# Patient Record
Sex: Male | Born: 1955 | Race: White | Hispanic: No | Smoking: Never smoker
Health system: Southern US, Community
[De-identification: ages and names within clinical notes are randomized; demographics above are authoritative.]

## PROBLEM LIST (undated history)

## (undated) DIAGNOSIS — E785 Hyperlipidemia, unspecified: Secondary | ICD-10-CM

## (undated) DIAGNOSIS — E119 Type 2 diabetes mellitus without complications: Secondary | ICD-10-CM

## (undated) DIAGNOSIS — I2510 Atherosclerotic heart disease of native coronary artery without angina pectoris: Secondary | ICD-10-CM

## (undated) DIAGNOSIS — B182 Chronic viral hepatitis C: Secondary | ICD-10-CM

## (undated) HISTORY — DX: Hyperlipidemia, unspecified: E78.5

## (undated) HISTORY — DX: Atherosclerotic heart disease of native coronary artery without angina pectoris: I25.10

## (undated) HISTORY — PX: CHOLECYSTECTOMY: SHX55

---

## 2004-09-28 ENCOUNTER — Emergency Department: Payer: Self-pay | Admitting: Emergency Medicine

## 2004-10-15 ENCOUNTER — Ambulatory Visit: Payer: Self-pay | Admitting: Family Medicine

## 2004-10-23 ENCOUNTER — Ambulatory Visit: Payer: Self-pay | Admitting: Family Medicine

## 2004-10-30 ENCOUNTER — Encounter (INDEPENDENT_AMBULATORY_CARE_PROVIDER_SITE_OTHER): Payer: Self-pay | Admitting: Internal Medicine

## 2004-10-30 ENCOUNTER — Ambulatory Visit: Payer: Self-pay | Admitting: Family Medicine

## 2004-11-01 ENCOUNTER — Encounter (INDEPENDENT_AMBULATORY_CARE_PROVIDER_SITE_OTHER): Payer: Self-pay | Admitting: Internal Medicine

## 2004-11-01 LAB — CONVERTED CEMR LAB: Hgb A1c MFr Bld: 10.6 %

## 2004-11-06 ENCOUNTER — Ambulatory Visit: Payer: Self-pay | Admitting: Family Medicine

## 2005-01-02 ENCOUNTER — Ambulatory Visit: Payer: Self-pay | Admitting: Family Medicine

## 2005-01-02 ENCOUNTER — Encounter (INDEPENDENT_AMBULATORY_CARE_PROVIDER_SITE_OTHER): Payer: Self-pay | Admitting: Internal Medicine

## 2005-01-02 LAB — CONVERTED CEMR LAB: Hgb A1c MFr Bld: 7 %

## 2005-02-04 ENCOUNTER — Ambulatory Visit: Payer: Self-pay | Admitting: Family Medicine

## 2005-04-07 ENCOUNTER — Encounter (INDEPENDENT_AMBULATORY_CARE_PROVIDER_SITE_OTHER): Payer: Self-pay | Admitting: Internal Medicine

## 2005-04-07 ENCOUNTER — Ambulatory Visit: Payer: Self-pay | Admitting: Family Medicine

## 2005-05-21 ENCOUNTER — Ambulatory Visit: Payer: Self-pay | Admitting: Family Medicine

## 2005-09-01 ENCOUNTER — Ambulatory Visit: Payer: Self-pay | Admitting: Family Medicine

## 2005-09-29 ENCOUNTER — Ambulatory Visit: Payer: Self-pay | Admitting: Family Medicine

## 2005-09-29 ENCOUNTER — Encounter (INDEPENDENT_AMBULATORY_CARE_PROVIDER_SITE_OTHER): Payer: Self-pay | Admitting: Internal Medicine

## 2005-09-29 LAB — CONVERTED CEMR LAB: Hgb A1c MFr Bld: 6.6 %

## 2006-10-06 ENCOUNTER — Emergency Department: Payer: Self-pay | Admitting: Unknown Physician Specialty

## 2006-10-06 ENCOUNTER — Other Ambulatory Visit: Payer: Self-pay

## 2006-10-06 ENCOUNTER — Encounter (INDEPENDENT_AMBULATORY_CARE_PROVIDER_SITE_OTHER): Payer: Self-pay | Admitting: Internal Medicine

## 2006-10-08 ENCOUNTER — Ambulatory Visit: Payer: Self-pay | Admitting: Unknown Physician Specialty

## 2007-02-18 ENCOUNTER — Ambulatory Visit: Payer: Self-pay | Admitting: Family Medicine

## 2007-02-18 DIAGNOSIS — R413 Other amnesia: Secondary | ICD-10-CM | POA: Insufficient documentation

## 2007-02-18 DIAGNOSIS — E7800 Pure hypercholesterolemia, unspecified: Secondary | ICD-10-CM | POA: Insufficient documentation

## 2007-02-18 DIAGNOSIS — E119 Type 2 diabetes mellitus without complications: Secondary | ICD-10-CM | POA: Insufficient documentation

## 2007-02-18 DIAGNOSIS — L299 Pruritus, unspecified: Secondary | ICD-10-CM | POA: Insufficient documentation

## 2007-02-23 LAB — CONVERTED CEMR LAB
ALT: 197 units/L — ABNORMAL HIGH (ref 0–53)
AST: 128 units/L — ABNORMAL HIGH (ref 0–37)
Albumin: 4.2 g/dL (ref 3.5–5.2)
Alkaline Phosphatase: 55 units/L (ref 39–117)
BUN: 16 mg/dL (ref 6–23)
Calcium: 9.7 mg/dL (ref 8.4–10.5)
Chloride: 104 meq/L (ref 96–112)
Creatinine,U: 157.5 mg/dL
GFR calc non Af Amer: 127 mL/min
Hgb A1c MFr Bld: 8.5 % — ABNORMAL HIGH (ref 4.6–6.0)
LDL Cholesterol: 106 mg/dL — ABNORMAL HIGH (ref 0–99)
PSA: 0.36 ng/mL (ref 0.10–4.00)
Sodium: 140 meq/L (ref 135–145)
VLDL: 17 mg/dL (ref 0–40)

## 2007-02-24 ENCOUNTER — Encounter (INDEPENDENT_AMBULATORY_CARE_PROVIDER_SITE_OTHER): Payer: Self-pay | Admitting: Internal Medicine

## 2007-02-25 ENCOUNTER — Ambulatory Visit: Payer: Self-pay | Admitting: Family Medicine

## 2007-02-25 DIAGNOSIS — R945 Abnormal results of liver function studies: Secondary | ICD-10-CM | POA: Insufficient documentation

## 2007-02-26 ENCOUNTER — Telehealth (INDEPENDENT_AMBULATORY_CARE_PROVIDER_SITE_OTHER): Payer: Self-pay | Admitting: *Deleted

## 2007-03-05 ENCOUNTER — Ambulatory Visit: Payer: Self-pay | Admitting: Internal Medicine

## 2007-03-08 ENCOUNTER — Encounter (INDEPENDENT_AMBULATORY_CARE_PROVIDER_SITE_OTHER): Payer: Self-pay | Admitting: Internal Medicine

## 2007-03-08 LAB — CONVERTED CEMR LAB
ALT: 182 units/L — ABNORMAL HIGH (ref 0–53)
Albumin: 4 g/dL (ref 3.5–5.2)
Bilirubin, Direct: 0.2 mg/dL (ref 0.0–0.3)
GGT: 88 units/L — ABNORMAL HIGH (ref 7–51)

## 2007-03-29 ENCOUNTER — Encounter: Payer: Self-pay | Admitting: Family Medicine

## 2007-03-29 ENCOUNTER — Encounter (INDEPENDENT_AMBULATORY_CARE_PROVIDER_SITE_OTHER): Payer: Self-pay | Admitting: Internal Medicine

## 2007-04-01 ENCOUNTER — Ambulatory Visit: Payer: Self-pay | Admitting: Gastroenterology

## 2007-04-01 ENCOUNTER — Encounter (INDEPENDENT_AMBULATORY_CARE_PROVIDER_SITE_OTHER): Payer: Self-pay | Admitting: Internal Medicine

## 2007-04-13 ENCOUNTER — Telehealth (INDEPENDENT_AMBULATORY_CARE_PROVIDER_SITE_OTHER): Payer: Self-pay | Admitting: Internal Medicine

## 2007-04-13 ENCOUNTER — Encounter (INDEPENDENT_AMBULATORY_CARE_PROVIDER_SITE_OTHER): Payer: Self-pay | Admitting: Internal Medicine

## 2007-04-14 ENCOUNTER — Encounter (INDEPENDENT_AMBULATORY_CARE_PROVIDER_SITE_OTHER): Payer: Self-pay | Admitting: Internal Medicine

## 2007-04-14 ENCOUNTER — Ambulatory Visit: Payer: Self-pay | Admitting: Family Medicine

## 2007-04-14 DIAGNOSIS — B1710 Acute hepatitis C without hepatic coma: Secondary | ICD-10-CM | POA: Insufficient documentation

## 2007-04-20 LAB — CONVERTED CEMR LAB: Hgb A1c MFr Bld: 8.4 % — ABNORMAL HIGH (ref 4.6–6.0)

## 2007-04-30 ENCOUNTER — Ambulatory Visit: Payer: Self-pay | Admitting: Family Medicine

## 2007-05-03 ENCOUNTER — Telehealth: Payer: Self-pay | Admitting: Family Medicine

## 2007-08-05 ENCOUNTER — Ambulatory Visit: Payer: Self-pay | Admitting: Gastroenterology

## 2007-08-10 ENCOUNTER — Ambulatory Visit: Payer: Self-pay | Admitting: Gastroenterology

## 2008-02-02 ENCOUNTER — Emergency Department: Payer: Self-pay | Admitting: Emergency Medicine

## 2008-05-28 ENCOUNTER — Emergency Department: Payer: Self-pay | Admitting: Emergency Medicine

## 2008-12-26 ENCOUNTER — Ambulatory Visit: Payer: Self-pay | Admitting: General Practice

## 2009-02-26 ENCOUNTER — Ambulatory Visit: Payer: Self-pay

## 2010-05-19 LAB — CONVERTED CEMR LAB
Blood Glucose, Fasting: 155 mg/dL
TSH: 1.46 microintl units/mL

## 2010-05-21 NOTE — Consult Note (Signed)
Summary: [facility]  [facility]   Imported By: Lanelle Bal 05/17/2009 12:27:22  _____________________________________________________________________  External Attachment:    Type:   Image     Comment:   External Document

## 2010-12-20 ENCOUNTER — Ambulatory Visit: Payer: Self-pay | Admitting: Gastroenterology

## 2011-01-17 ENCOUNTER — Ambulatory Visit: Payer: Self-pay | Admitting: General Surgery

## 2011-01-20 ENCOUNTER — Ambulatory Visit: Payer: Self-pay | Admitting: General Surgery

## 2011-01-20 LAB — PATHOLOGY REPORT

## 2011-01-22 LAB — PATHOLOGY REPORT

## 2011-04-30 ENCOUNTER — Emergency Department: Payer: Self-pay | Admitting: Internal Medicine

## 2012-06-18 ENCOUNTER — Ambulatory Visit: Payer: Self-pay | Admitting: Oncology

## 2012-06-18 LAB — CBC CANCER CENTER
Eosinophil %: 3.8 %
HGB: 15.4 g/dL (ref 13.0–18.0)
Lymphocyte %: 35.9 %
MCH: 33.1 pg (ref 26.0–34.0)
Monocyte %: 9.7 %
Neutrophil #: 2.3 x10 3/mm (ref 1.4–6.5)
Neutrophil %: 49.5 %
Platelet: 91 x10 3/mm — ABNORMAL LOW (ref 150–440)
RDW: 13.3 % (ref 11.5–14.5)

## 2012-06-18 LAB — FERRITIN: Ferritin (ARMC): 670 ng/mL — ABNORMAL HIGH (ref 8–388)

## 2012-06-18 LAB — IRON AND TIBC
Iron Bind.Cap.(Total): 371 ug/dL (ref 250–450)
Iron Saturation: 62 %

## 2012-06-19 ENCOUNTER — Ambulatory Visit: Payer: Self-pay | Admitting: Oncology

## 2012-07-20 ENCOUNTER — Ambulatory Visit: Payer: Self-pay | Admitting: Oncology

## 2012-07-27 ENCOUNTER — Emergency Department: Payer: Self-pay | Admitting: Emergency Medicine

## 2012-07-27 LAB — CBC
HCT: 40.9 % (ref 40.0–52.0)
MCH: 33.6 pg (ref 26.0–34.0)
MCV: 98 fL (ref 80–100)
RBC: 4.18 10*6/uL — ABNORMAL LOW (ref 4.40–5.90)

## 2012-07-27 LAB — COMPREHENSIVE METABOLIC PANEL
Albumin: 3.9 g/dL (ref 3.4–5.0)
Alkaline Phosphatase: 86 U/L (ref 50–136)
Anion Gap: 4 — ABNORMAL LOW (ref 7–16)
Bilirubin,Total: 0.6 mg/dL (ref 0.2–1.0)
Calcium, Total: 8.9 mg/dL (ref 8.5–10.1)
Chloride: 108 mmol/L — ABNORMAL HIGH (ref 98–107)
Co2: 29 mmol/L (ref 21–32)
Creatinine: 0.88 mg/dL (ref 0.60–1.30)
EGFR (African American): >60
EGFR (Non-African Amer.): >60
Osmolality: 280 (ref 275–301)
Potassium: 3.5 mmol/L (ref 3.5–5.1)
SGPT (ALT): 190 U/L — ABNORMAL HIGH (ref 12–78)
Total Protein: 8.3 g/dL — ABNORMAL HIGH (ref 6.4–8.2)

## 2012-07-27 LAB — PRO B NATRIURETIC PEPTIDE: B-Type Natriuretic Peptide: 33 pg/mL (ref 0–125)

## 2012-07-27 LAB — MAGNESIUM: Magnesium: 1.8 mg/dL

## 2012-07-27 LAB — TSH: Thyroid Stimulating Horm: 6.37 u[IU]/mL — ABNORMAL HIGH

## 2012-07-27 LAB — CK TOTAL AND CKMB (NOT AT ARMC): CK-MB: <0.5 ng/mL — ABNORMAL LOW (ref 0.5–3.6)

## 2012-08-19 ENCOUNTER — Ambulatory Visit: Payer: Self-pay | Admitting: Oncology

## 2012-09-03 LAB — CBC CANCER CENTER
Basophil #: 0 x10 3/mm (ref 0.0–0.1)
Basophil %: 0.3 %
Eosinophil %: 4.3 %
HGB: 14.1 g/dL (ref 13.0–18.0)
Lymphocyte #: 1.4 x10 3/mm (ref 1.0–3.6)
Lymphocyte %: 31.8 %
MCH: 33.5 pg (ref 26.0–34.0)
MCV: 97 fL (ref 80–100)
Monocyte #: 0.3 x10 3/mm (ref 0.2–1.0)
Monocyte %: 7.2 %
Neutrophil #: 2.5 x10 3/mm (ref 1.4–6.5)
Neutrophil %: 56.4 %
Platelet: 88 x10 3/mm — ABNORMAL LOW (ref 150–440)
RBC: 4.21 10*6/uL — ABNORMAL LOW (ref 4.40–5.90)
WBC: 4.5 x10 3/mm (ref 3.8–10.6)

## 2012-09-03 LAB — IRON AND TIBC
Iron Bind.Cap.(Total): 381 ug/dL (ref 250–450)
Iron: 150 ug/dL (ref 65–175)
Unbound Iron-Bind.Cap.: 231 ug/dL

## 2012-09-19 ENCOUNTER — Ambulatory Visit: Payer: Self-pay | Admitting: Oncology

## 2012-10-19 ENCOUNTER — Ambulatory Visit: Payer: Self-pay | Admitting: Oncology

## 2012-11-19 ENCOUNTER — Ambulatory Visit: Payer: Self-pay | Admitting: Oncology

## 2012-12-17 LAB — CBC CANCER CENTER
Basophil #: 0 x10 3/mm (ref 0.0–0.1)
Basophil %: 0.9 %
Eosinophil %: 4.6 %
HCT: 43.4 % (ref 40.0–52.0)
HGB: 14.9 g/dL (ref 13.0–18.0)
Lymphocyte #: 1.6 x10 3/mm (ref 1.0–3.6)
MCV: 94 fL (ref 80–100)
Monocyte #: 0.4 x10 3/mm (ref 0.2–1.0)
RBC: 4.61 10*6/uL (ref 4.40–5.90)
WBC: 3.9 x10 3/mm (ref 3.8–10.6)

## 2012-12-17 LAB — IRON AND TIBC: Iron Bind.Cap.(Total): 441 ug/dL (ref 250–450)

## 2012-12-17 LAB — FERRITIN: Ferritin (ARMC): 100 ng/mL (ref 8–388)

## 2012-12-20 ENCOUNTER — Ambulatory Visit: Payer: Self-pay | Admitting: Oncology

## 2012-12-20 ENCOUNTER — Ambulatory Visit: Payer: Self-pay | Admitting: Endocrinology

## 2013-03-25 ENCOUNTER — Ambulatory Visit: Payer: Self-pay | Admitting: Oncology

## 2015-02-24 IMAGING — CR DG CHEST 2V
1 series · 2 of 2 positions shown · non-contrast
Comparison: none

REASON FOR EXAM: Chest Pain
COMMENTS:

[Series 1: x chest ap · 0.14mm/px · 2 of 2 slices shown]
[im 1/2]
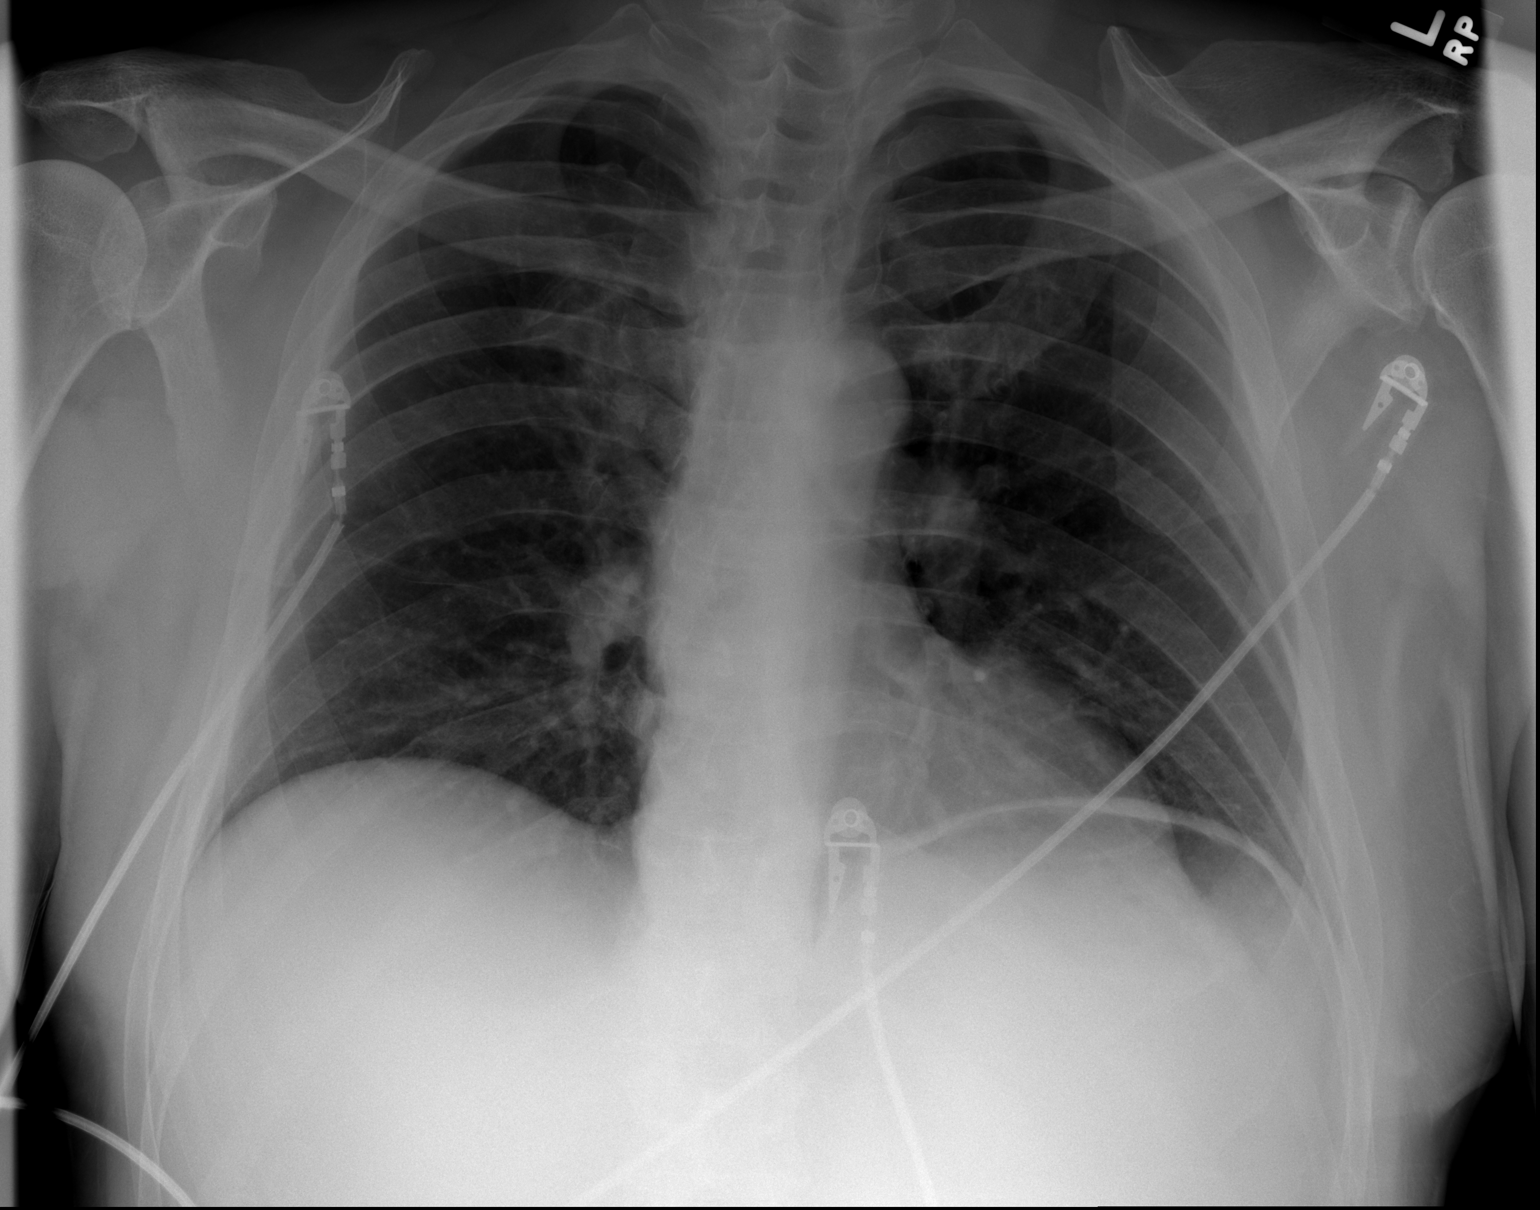
[im 2/2]
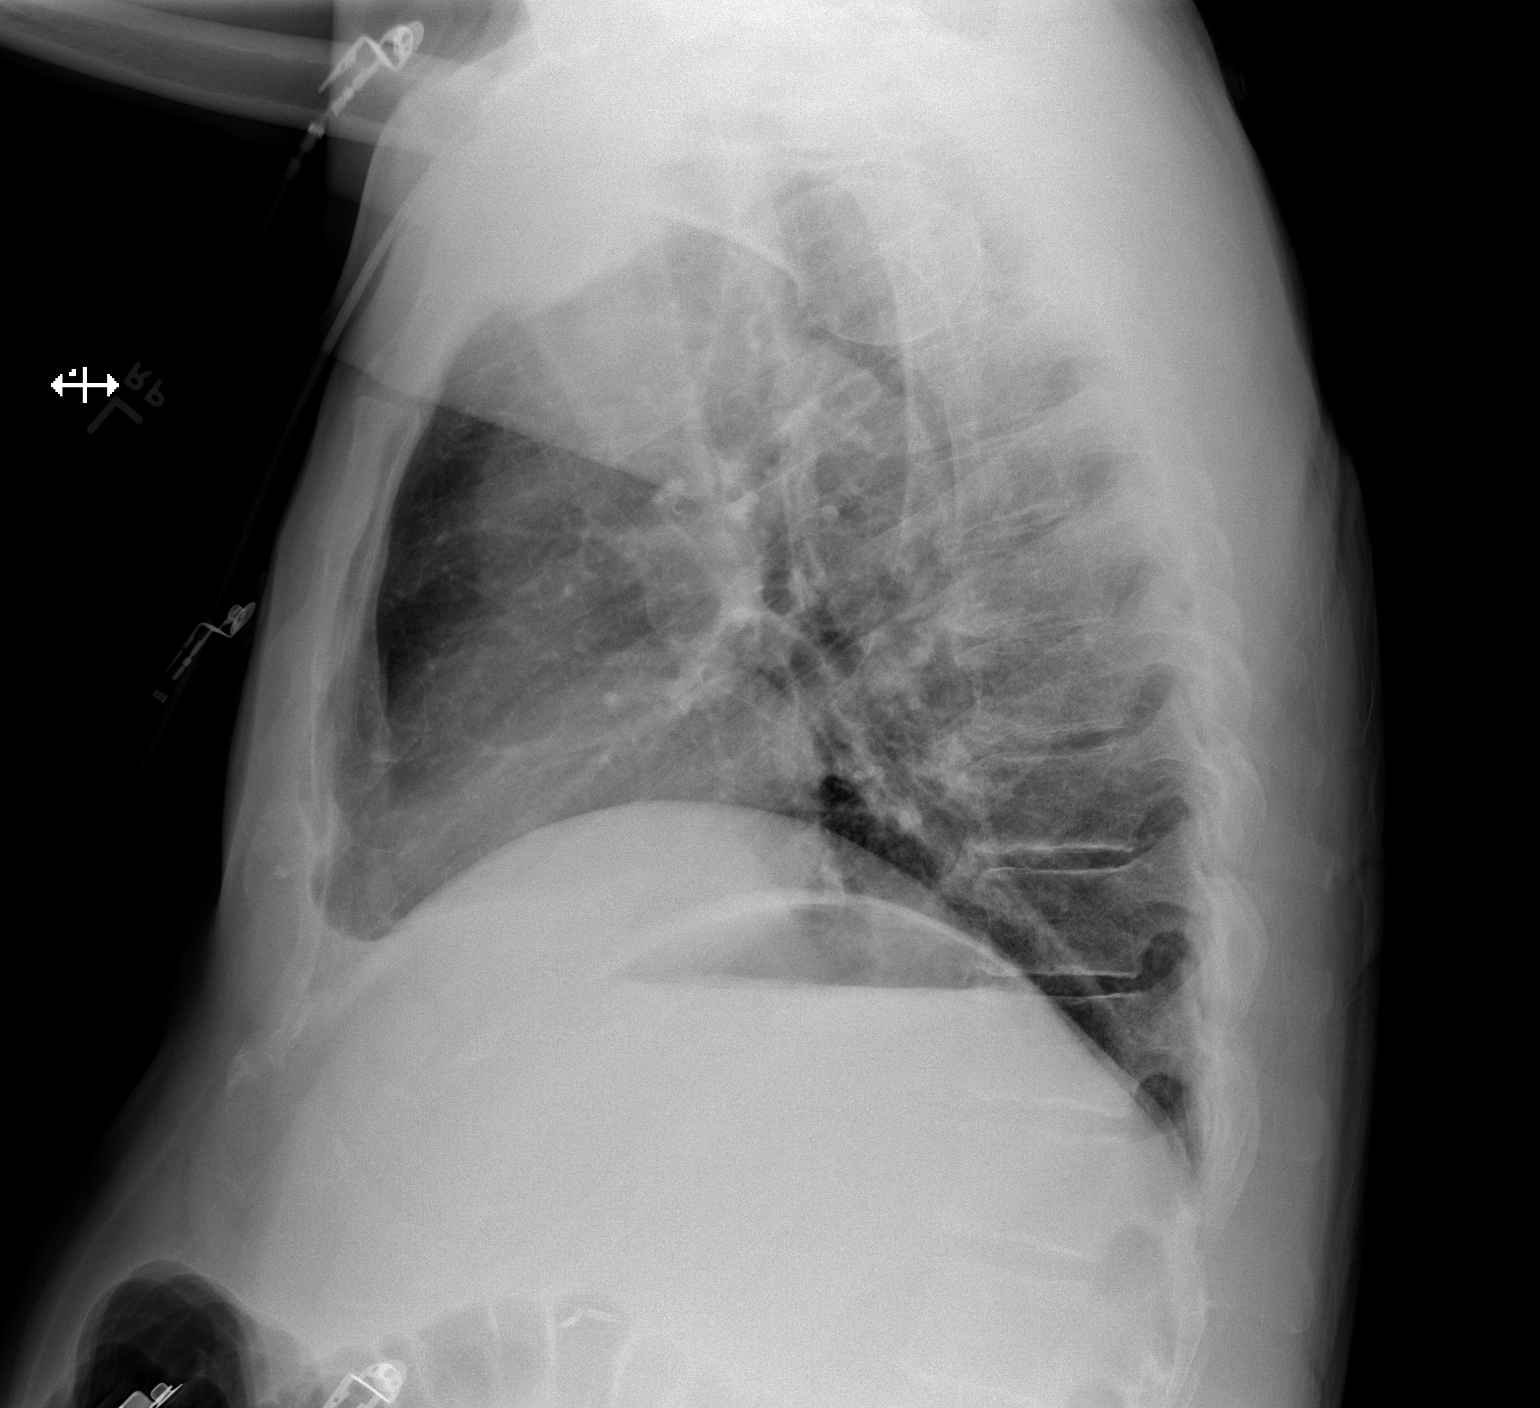

[2 of 2 positions shown; findings below may reference images not displayed]

PROCEDURE:     DXR - DXR CHEST PA (OR AP) AND LATERAL  - July 27, 2012  [DATE]

RESULT:     The lungs are reasonably well inflated. There is no focal
infiltrate. There is partial obscuration of the left hemidiaphragm on the
frontal film. Coarse lung markings are noted at the left lung base
posteriorly. The cardiac silhouette is normal in size. The mediastinum is
normal in width. There is no pleural effusion.
IMPRESSION: There is patchy increased density in the left lower lobe
posteriorly suggesting subsegmental atelectasis. There is no evidence of
CHF.

[REDACTED]

## 2016-04-08 ENCOUNTER — Emergency Department
Admission: EM | Admit: 2016-04-08 | Discharge: 2016-04-08 | Disposition: A | Discharge status: Home / Self Care | Payer: Commercial Managed Care - PPO | Attending: Emergency Medicine | Admitting: Emergency Medicine

## 2016-04-08 ENCOUNTER — Emergency Department: Payer: Commercial Managed Care - PPO

## 2016-04-08 ENCOUNTER — Encounter: Payer: Self-pay | Admitting: Emergency Medicine

## 2016-04-08 DIAGNOSIS — S20212A Contusion of left front wall of thorax, initial encounter: Secondary | ICD-10-CM | POA: Insufficient documentation

## 2016-04-08 DIAGNOSIS — S299XXA Unspecified injury of thorax, initial encounter: Secondary | ICD-10-CM | POA: Diagnosis present

## 2016-04-08 DIAGNOSIS — E119 Type 2 diabetes mellitus without complications: Secondary | ICD-10-CM | POA: Diagnosis not present

## 2016-04-08 DIAGNOSIS — Y999 Unspecified external cause status: Secondary | ICD-10-CM | POA: Diagnosis not present

## 2016-04-08 DIAGNOSIS — Y92512 Supermarket, store or market as the place of occurrence of the external cause: Secondary | ICD-10-CM | POA: Insufficient documentation

## 2016-04-08 DIAGNOSIS — Y939 Activity, unspecified: Secondary | ICD-10-CM | POA: Insufficient documentation

## 2016-04-08 HISTORY — DX: Chronic viral hepatitis C: B18.2

## 2016-04-08 HISTORY — DX: Type 2 diabetes mellitus without complications: E11.9
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
## 2016-04-08 NOTE — ED Provider Notes (Signed)
ED ECG REPORT I, Honestee Revard, the attending physician, personally viewed and interpreted this ECG.  Date: 04/08/2016 EKG Time: 1650 Rate: 75 Rhythm: normal sinus rhythm QRS Axis: normal Intervals: normal ST/T Wave abnormalities: normal Conduction Disturbances: No acute ischemia, probable mild left ventricular hypertrophy Narrative Interpretation: unremarkable except for mild LVH    Sharyn CreamerMark Rafal Archuleta, MD 04/08/16 1653
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
## 2016-04-08 NOTE — ED Notes (Signed)
Pt informed to return if any life threatening symptoms occur.  

## 2019-03-08 ENCOUNTER — Emergency Department: Payer: Commercial Managed Care - PPO

## 2019-03-08 ENCOUNTER — Other Ambulatory Visit: Payer: Self-pay

## 2019-03-08 ENCOUNTER — Emergency Department
Admission: EM | Admit: 2019-03-08 | Discharge: 2019-03-08 | Disposition: A | Discharge status: Home / Self Care | Payer: Commercial Managed Care - PPO | Attending: Emergency Medicine | Admitting: Emergency Medicine

## 2019-03-08 ENCOUNTER — Encounter: Payer: Self-pay | Admitting: Emergency Medicine

## 2019-03-08 DIAGNOSIS — E119 Type 2 diabetes mellitus without complications: Secondary | ICD-10-CM | POA: Insufficient documentation

## 2019-03-08 DIAGNOSIS — Z794 Long term (current) use of insulin: Secondary | ICD-10-CM | POA: Insufficient documentation

## 2019-03-08 DIAGNOSIS — M5412 Radiculopathy, cervical region: Secondary | ICD-10-CM | POA: Diagnosis not present

## 2019-03-08 DIAGNOSIS — Z79899 Other long term (current) drug therapy: Secondary | ICD-10-CM | POA: Diagnosis not present

## 2019-03-08 DIAGNOSIS — M25512 Pain in left shoulder: Secondary | ICD-10-CM | POA: Diagnosis present

## 2019-03-08 DIAGNOSIS — M62838 Other muscle spasm: Secondary | ICD-10-CM | POA: Diagnosis not present

## 2019-03-08 MED ORDER — OXYCODONE-ACETAMINOPHEN 5-325 MG PO TABS
1.0000 | ORAL_TABLET | Freq: Once | ORAL | Status: AC
  Administered 2019-03-08: 1 via ORAL
  Filled 2019-03-08: qty 1

## 2019-03-08 MED ORDER — LIDOCAINE 5 % EX PTCH
1.0000 | MEDICATED_PATCH | CUTANEOUS | Status: DC
  Administered 2019-03-08: 1 via TRANSDERMAL
  Filled 2019-03-08: qty 1

## 2019-03-08 MED ORDER — ORPHENADRINE CITRATE 30 MG/ML IJ SOLN
60.0000 mg | Freq: Two times a day (BID) | INTRAMUSCULAR | Status: DC
  Filled 2019-03-08: qty 2

## 2019-03-08 MED ORDER — CYCLOBENZAPRINE HCL 5 MG PO TABS: ORAL_TABLET | ORAL | 0 refills | Status: DC

## 2019-03-08 MED ORDER — TRAMADOL HCL 50 MG PO TABS: 50.0000 mg | ORAL_TABLET | Freq: Four times a day (QID) | ORAL | 0 refills | Status: AC | PRN

## 2019-03-08 MED ORDER — LIDOCAINE 5 % EX PTCH: 1.0000 | MEDICATED_PATCH | CUTANEOUS | 0 refills | Status: DC

## 2019-03-08 MED ORDER — DIAZEPAM 2 MG PO TABS
2.0000 mg | ORAL_TABLET | Freq: Once | ORAL | Status: DC
  Filled 2019-03-08: qty 1

## 2019-03-08 MED ORDER — IBUPROFEN 600 MG PO TABS: 600.0000 mg | ORAL_TABLET | Freq: Four times a day (QID) | ORAL | 0 refills | Status: DC | PRN

## 2019-03-08 NOTE — ED Notes (Signed)
Pt refused having the EKG done  Provider aware

## 2019-03-08 NOTE — ED Provider Notes (Signed)
[facility] Emergency Department Provider Note  ____________________________________________  Time seen: Approximately 9:07 AM  I have reviewed the triage vital signs and the nursing notes.   HISTORY  Chief Complaint Shoulder Pain    HPI Jorge Gregory is a 63 y.o. male that presents to the emergency department for evaluation of left posterior shoulder pain today.  Pain is in the back of his shoulder.  He has a pinpoint area of tenderness.  Pain is relieved when he holds his arm over his head.  When he holds his arms to his side, he will get a shooting pain down his left arm.  Patient went to the chiropractor today, who sent him to the emergency department for medication management. Patient took 6 ibuprofen this morning. He has a history of a rotator cuff injury, for which she sees the chiropractor for.  No shortness of breath, chest pain.   Past Medical History:  Diagnosis Date  . Diabetes mellitus without complication (HCC)   . Hep C w/ coma, chronic [facility](HCC)     Patient Active Problem List   Diagnosis Date Noted  . HEPATITIS C 04/14/2007  . LIVER FUNCTION TESTS, ABNORMAL 02/25/2007  . 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 HYPERCHOLESTEROLEMIA, PURE 02/18/2007  . PRURITUS 02/18/2007  . SYMPTOM, MEMORY LOSS 02/18/2007    Past Surgical History:  Procedure Laterality Date  . CHOLECYSTECTOMY      Prior to Admission medications   Medication Sig Start Date End Date Taking? Authorizing Provider  atorvastatin (LIPITOR) 10 MG tablet Take 10 mg by mouth daily.   Yes [provider]  enalapril (VASOTEC) 5 MG tablet Take 5 mg by mouth daily.   Yes [provider]  insulin aspart (NOVOLOG) 100 UNIT/ML injection Inject into the skin 3 (three) times daily before meals.   Yes [provider]  insulin glargine (LANTUS) 100 UNIT/ML injection Inject into the skin daily.   Yes [provider]  cyclobenzaprine (FLEXERIL) 5 MG tablet Take 1-2 tablets 3  times daily as needed 03/08/19   [place]id DerryWagner, Evian Derringer, PA-C  ibuprofen (ADVIL) 600 MG tablet Take 1 tablet (600 mg total) by mouth every 6 (six) hours as needed. 03/08/19   Enid DerryWagner, Kaisa Wofford, PA-C  lidocaine (LIDODERM) 5 % Place 1 patch onto the skin daily. Remove & Discard patch within 12 hours or as directed by MD 03/08/19   En[place] DerryWagner, Jahni Nazar, PA-C  traMADol (ULTRAM) 50 MG tablet Take 1 tablet (50 mg total) by mouth every 6 (six) hours as needed. 03/08/19 03/07/20  Enid DerryWagner, Joncarlos Atkison, PA-C    Allergies Fluoxetine hcl and Percocet [oxycodone-acetaminophen]  No family history on file.  Social History Social History   Tobacco Use  . Smoking status: Never Smoker  . Smokeless tobacco: Never Used  Substance Use Topics  . Alcohol use: No  . Drug use: Not on file     Review of Systems  Cardiovascular: No chest pain. Respiratory: No SOB. Gastrointestinal: No nausea, no vomiting.  Musculoskeletal: Positive for shoulder pain. Skin: Negative for rash, abrasions, lacerations, ecchymosis. Neurological: Negative for headaches, numbness or tingling   ____________________________________________   PHYSICAL EXAM:  VITAL SIGNS: ED Triage Vitals [03/08/19 0842]  Enc Vitals Group     BP (!) 186/84     Pulse Rate 62     Resp 20     Temp 97.6 F (36.4 C)     Temp Source Oral     SpO2 99 %     Weight  Height      Head Circumference      Peak Flow      Pain Score 10     Pain Loc      Pain Edu?      Excl. in GC?      Constitutional: Alert and oriented. Well appearing and in no acute distress. Eyes: Conjunctivae are normal. PERRL. EOMI. Head: Atraumatic. ENT:      Ears:      Nose: No congestion/rhinnorhea.      Mouth/Throat: Mucous membranes are moist.  Neck: No stridor. No cervical spine tenderness to palpation.  Tenderness to palpation to trapezius between neck and left shoulder.  Cardiovascular: Normal rate, regular rhythm.  Good peripheral circulation.  Symmetric radial pulses  bilaterally. Respiratory: Normal respiratory effort without tachypnea or retractions. Lungs CTAB. Good air entry to the bases with no decreased or absent breath sounds. Gastrointestinal: Bowel sounds 4 quadrants. Soft and nontender to palpation. No guarding or rigidity. No palpable masses. No distention. Musculoskeletal: Full range of motion to all extremities. No gross deformities appreciated.  Full range of motion of shoulder.  Pain improves with abduction of shoulder. Neurologic:  Normal speech and language. No gross focal neurologic deficits are appreciated.  Skin:  Skin is warm, dry and intact. No rash noted. Psychiatric: Mood and affect are normal. Speech and behavior are normal. Patient exhibits appropriate insight and judgement.   ____________________________________________   LABS (all labs ordered are listed, but only abnormal results are displayed)  Labs Reviewed - No data to display ____________________________________________  EKG   ____________________________________________  RADIOLOGY Lexine Baton, personally viewed and evaluated these images (plain radiographs) as part of my medical decision making, as well as reviewing the written report by the radiologist.  Dg Cervical Spine 2-3 Views  Result Date: 03/08/2019 CLINICAL DATA:  Left-sided radicular symptoms EXAM: CERVICAL SPINE - 2-3 VIEW COMPARISON:  None. FINDINGS: Frontal, lateral, and open-mouth odontoid images were obtained. There is no appreciable fracture or spondylolisthesis. Prevertebral soft tissues and predental space regions are normal. There is moderately severe disc space narrowing at C5-6. There is mild disc space narrowing at C6-7. There are anterior osteophytes at C5 and C6. No erosive change. Lung apices are clear. There is reversal of lordotic curvature. IMPRESSION: Reversal of lordotic curvature, a finding most likely indicative of a degree of muscle spasm. No fracture or spondylolisthesis.  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 Electronically Signed   By: Bretta Bang III M.D.   On: 03/08/2019 09:54   Dg Shoulder Left  Result Date: 03/08/2019 CLINICAL DATA:  Radiculopathy EXAM: LEFT SHOULDER - 2+ VIEW COMPARISON:  None. FINDINGS: No fracture or dislocation of the left shoulder. There is mild glenohumeral and moderate acromioclavicular arthrosis. The partially imaged left chest is unremarkable. IMPRESSION: No fracture or dislocation of the left shoulder. There is mild glenohumeral and moderate acromioclavicular arthrosis. Electronically Signed   By: Lauralyn Primes M.D.   On: 03/08/2019 09:54    ____________________________________________    PROCEDURES  Procedure(s) performed:    Procedures    Medications  oxyCODONE-acetaminophen (PERCOCET/ROXICET) 5-325 MG per tablet 1 tablet (1 tablet Oral Given 03/08/19 0933)     ____________________________________________   INITIAL IMPRESSION / ASSESSMENT AND PLAN / ED COURSE  Pertinent labs & imaging results that were available during my care of the patient were reviewed by me and considered in my medical decision making (see chart for details).  Review of the [place] CSRS was performed in accordance of  the [place] prior to dispensing any controlled drugs.   Patient presented to the emergency department for acute on chronic left posterior shoulder pain.  Vital signs and exam are reassuring.  Cervical x-ray consistent with osteoarthritis and muscle spasm.  Shoulder x-ray consistent with muscle spasm.  Percocet and Norflex were initially ordered for pain control.  RN notified me that patients daughter then remembered that patient has had a reaction to Percocet in the past. Patient told RN diazepam has worked for him in the past and he would prefer this. Diazepam was ordered and RN then administered Percocet in error, thinking it was the diazepam that she administered. Pain improved with medication. After one hour,  patient does not want to wait any longer in the emergency department for observation for medication reaction. He will be with his daughter all day. He has a massage scheduled for noon. Patient will be discharged home with prescriptions for tramadol, flexeril, and advil. Patient is to follow up with PCP and chiropractor as directed. Patient is given ED precautions to return to the ED for any worsening or new symptoms.   Stepan A Klingler was evaluated in Emergency Department on 03/08/2019 for the symptoms described in the history of present illness. He was evaluated in the context of the global COVID-19 pandemic, which necessitated consideration that the patient might be at risk for infection with the SARS-CoV-2 virus that causes COVID-19. Institutional protocols and algorithms that pertain to the evaluation of patients at risk for COVID-19 are in a state of rapid change based on information released by regulatory bodies including the CDC and federal and state organizations. These policies and algorithms were followed during the patient's care in the ED.  ____________________________________________  FINAL CLINICAL IMPRESSION(S) / ED DIAGNOSES  Final diagnoses:  Muscle spasm  Cervical radiculopathy      NEW MEDICATIONS STARTED DURING THIS VISIT:  ED Discharge Orders         Ordered    cyclobenzaprine (FLEXERIL) 5 MG tablet     03/08/19 1118    traMADol (ULTRAM) 50 MG tablet  Every 6 hours PRN     03/08/19 1118    lidocaine (LIDODERM) 5 %  Every 24 hours     03/08/19 1118    ibuprofen (ADVIL) 600 MG tablet  Every 6 hours PRN     03/08/19 1118              This chart was dictated using voice recognition software/Dragon. Despite best efforts to proofread, errors can occur which can change the meaning. Any change was purely unintentional.    Laban Emperor, PA-C 03/08/19 1551    Vanessa Allen, MD 03/09/19 1228

## 2019-03-08 NOTE — ED Notes (Signed)
States pain went from 12/10 to 10/10    Pain is eased when bending his head down  Provider aware

## 2019-03-08 NOTE — ED Triage Notes (Addendum)
Left shoulder pain , position of comfort is holding the arm up , was seen pta at the chiropractor sent her for further eval

## 2019-03-08 NOTE — Discharge Instructions (Addendum)
Follow-up with massage therapy and chiropractor.  Also make an appointment with your primary care provider this week.  You can take ibuprofen for pain and inflammation, Flexeril to help relax your muscles, tramadol for extreme pain.  Please return to the emergency department for evaluation of any worsening of symptoms.

## 2019-03-08 NOTE — ED Notes (Signed)
See triage note  Presents with posterior left shoulder pain   States went to chiropractor PTA

## 2019-03-14 ENCOUNTER — Encounter: Payer: Self-pay | Admitting: *Deleted

## 2019-03-14 ENCOUNTER — Emergency Department
Admission: EM | Admit: 2019-03-14 | Discharge: 2019-03-14 | Disposition: A | Discharge status: Home / Self Care | Payer: Commercial Managed Care - PPO | Attending: Emergency Medicine | Admitting: Emergency Medicine

## 2019-03-14 ENCOUNTER — Other Ambulatory Visit: Payer: Self-pay

## 2019-03-14 ENCOUNTER — Emergency Department: Payer: Commercial Managed Care - PPO

## 2019-03-14 DIAGNOSIS — R200 Anesthesia of skin: Secondary | ICD-10-CM | POA: Diagnosis not present

## 2019-03-14 DIAGNOSIS — E119 Type 2 diabetes mellitus without complications: Secondary | ICD-10-CM | POA: Diagnosis not present

## 2019-03-14 DIAGNOSIS — M542 Cervicalgia: Secondary | ICD-10-CM | POA: Diagnosis present

## 2019-03-14 DIAGNOSIS — Z794 Long term (current) use of insulin: Secondary | ICD-10-CM | POA: Diagnosis not present

## 2019-03-14 DIAGNOSIS — R531 Weakness: Secondary | ICD-10-CM | POA: Diagnosis not present

## 2019-03-14 DIAGNOSIS — M79602 Pain in left arm: Secondary | ICD-10-CM | POA: Diagnosis not present

## 2019-03-14 DIAGNOSIS — M5412 Radiculopathy, cervical region: Secondary | ICD-10-CM

## 2019-03-14 LAB — CBC
HCT: 43.2 % (ref 39.0–52.0)
Hemoglobin: 15.5 g/dL (ref 13.0–17.0)
MCH: 30.7 pg (ref 26.0–34.0)
MCHC: 35.9 g/dL (ref 30.0–36.0)
MCV: 85.5 fL (ref 80.0–100.0)
Platelets: 187 10*3/uL (ref 150–400)
RBC: 5.05 MIL/uL (ref 4.22–5.81)
RDW: 12.2 % (ref 11.5–15.5)
WBC: 7.6 10*3/uL (ref 4.0–10.5)
nRBC: 0 % (ref 0.0–0.2)

## 2019-03-14 LAB — BASIC METABOLIC PANEL
Anion gap: 13 (ref 5–15)
BUN: 21 mg/dL (ref 8–23)
CO2: 23 mmol/L (ref 22–32)
Calcium: 10.1 mg/dL (ref 8.9–10.3)
Chloride: 101 mmol/L (ref 98–111)
Creatinine, Ser: 0.81 mg/dL (ref 0.61–1.24)
GFR calc Af Amer: >60 mL/min (ref >60)
GFR calc non Af Amer: >60 mL/min (ref >60)
Glucose, Bld: 146 mg/dL — ABNORMAL HIGH (ref 70–99)
Potassium: 4.3 mmol/L (ref 3.5–5.1)
Sodium: 137 mmol/L (ref 135–145)

## 2019-03-14 LAB — TROPONIN I (HIGH SENSITIVITY): Troponin I (High Sensitivity): <2 ng/L (ref <18)

## 2019-03-14 MED ORDER — IBUPROFEN 600 MG PO TABS
600.0000 mg | ORAL_TABLET | Freq: Once | ORAL | Status: AC
  Administered 2019-03-14: 17:00:00 600 mg via ORAL
  Filled 2019-03-14: qty 1

## 2019-03-14 MED ORDER — OXYCODONE-ACETAMINOPHEN 5-325 MG PO TABS: 1.0000 | ORAL_TABLET | Freq: Four times a day (QID) | ORAL | 0 refills | Status: AC | PRN

## 2019-03-14 MED ORDER — HYDROMORPHONE HCL 1 MG/ML IJ SOLN
1.0000 mg | Freq: Once | INTRAMUSCULAR | Status: AC
  Administered 2019-03-14: 1 mg via INTRAVENOUS
  Filled 2019-03-14: qty 1

## 2019-03-14 MED ORDER — METHYLPREDNISOLONE 4 MG PO TABS: ORAL_TABLET | ORAL | 0 refills | Status: DC

## 2019-03-14 MED ORDER — ONDANSETRON HCL 4 MG/2ML IJ SOLN
4.0000 mg | Freq: Once | INTRAMUSCULAR | Status: AC
  Administered 2019-03-14: 4 mg via INTRAVENOUS
  Filled 2019-03-14: qty 2

## 2019-03-14 MED ORDER — HYDROCODONE-ACETAMINOPHEN 5-325 MG PO TABS
1.0000 | ORAL_TABLET | Freq: Once | ORAL | Status: AC
  Administered 2019-03-14: 1 via ORAL
  Filled 2019-03-14: qty 1

## 2019-03-14 MED ORDER — OXYCODONE-ACETAMINOPHEN 5-325 MG PO TABS
1.0000 | ORAL_TABLET | Freq: Once | ORAL | Status: AC
  Administered 2019-03-14: 1 via ORAL
  Filled 2019-03-14: qty 1

## 2019-03-14 NOTE — ED Notes (Signed)
Ok to d/c repeat trop per Dr. Cherylann Banas

## 2019-03-14 NOTE — ED Notes (Signed)
Per MRI pt cannot lay down for MRI due to pain, Dr. Cherylann Banas informed

## 2019-03-14 NOTE — ED Provider Notes (Signed)
[facility] Emergency Department Provider Note ____________________________________________   First MD Initiated Contact with Patient 03/14/19 1650     (approximate)  I have reviewed the triage vital signs and the nursing notes.   HISTORY  Chief Complaint Neck Pain and Arm Pain    HPI Jorge Gregory is a 63 y.o. male with PMH as noted below who presents with neck pain, acute onset when he awoke 8 days ago, and persistent since that time.  He states it is in the upper middle part of his neck and radiating down the left trapezius and into the left arm.  He reports weakness and numbness to the left arm.  He denies any chest pain, difficulty breathing, or any lightheadedness.  The patient denies any injury.  He has no headache.  He states the pain is relieved if he puts his left arm up over his head.  He has taken hydrocodone, Flexeril, and Toradol with minimal relief.  The patient was initially seen here in the ED.  Subsequently he went to [facility] a few days ago and was told he likely had a pinched nerve.  They were planning to order an MRI for him, but he states he never got a phone call and feels like he cannot deal with the pain.  Past Medical History:  Diagnosis Date   Diabetes mellitus without complication ([place])    Hep C w/ coma, chronic ([place])     Patient Active Problem List   Diagnosis Date Noted   HEPATITIS C 04/14/2007   LIVER FUNCTION TESTS, ABNORMAL 02/25/2007   AODM 02/18/2007   HYPERCHOLESTEROLEMIA, PURE 02/18/2007   PRURITUS 02/18/2007   SYMPTOM, MEMORY LOSS 02/18/2007    Past Surgical History:  Procedure Laterality Date   CHOLECYSTECTOMY      Prior to Admission medications   Medication Sig Start Date End Date Taking? Authorizing Provider  atorvastatin (LIPITOR) 10 MG tablet Take 10 mg by mouth daily.    [provider]  cyclobenzaprine (FLEXERIL) 5 MG tablet Take 1-2 tablets 3 times daily as needed 03/08/19    Laban Emperor, PA-C  enalapril (VASOTEC) 5 MG tablet Take 5 mg by mouth daily.    [provider]  ibuprofen (ADVIL) 600 MG tablet Take 1 tablet (600 mg total) by mouth every 6 (six) hours as needed. 03/08/19   Laban Emperor, PA-C  insulin aspart (NOVOLOG) 100 UNIT/ML injection Inject into the skin 3 (three) times daily before meals.    [provider]  insulin glargine (LANTUS) 100 UNIT/ML injection Inject into the skin daily.    [provider]  lidocaine (LIDODERM) 5 % Place 1 patch onto the skin daily. Remove & Discard patch within 12 hours or as directed by MD 03/08/19   Laban Emperor, PA-C  methylPREDNISolone (MEDROL) 4 MG tablet 6 tablets by mouth on day 1, 5 tabs on day 2, 4 tabs on day 3, 3 tabs on day 4, 2 tabs on day 5, 1 tab on day 6 03/14/19   Arta Silence, MD  oxyCODONE-acetaminophen (PERCOCET) 5-325 MG tablet Take 1-2 tablets by mouth every 6 (six) hours as needed for up to 3 days for severe pain. 03/14/19 03/17/19  Arta Silence, MD  traMADol (ULTRAM) 50 MG tablet Take 1 tablet (50 mg total) by mouth every 6 (six) hours as needed. 03/08/19 03/07/20  Laban Emperor, PA-C    Allergies Fluoxetine hcl and Percocet [oxycodone-acetaminophen]  History reviewed. No pertinent family history.  Social History Social History  Tobacco Use   Smoking status: Never Smoker   Smokeless tobacco: Never Used  Substance Use Topics   Alcohol use: No   Drug use: Not on file    Review of Systems  Constitutional: No fever. Eyes: No redness. ENT: Positive for neck pain. Cardiovascular: Denies chest pain. Respiratory: Denies shortness of breath. Gastrointestinal: No vomiting. Genitourinary: Negative for flank pain.  Musculoskeletal: Negative for back pain. Skin: Negative for rash. Neurological: Positive for left arm weakness.   ____________________________________________   PHYSICAL EXAM:  VITAL SIGNS: ED Triage Vitals  Enc Vitals  Group     BP 03/14/19 1525 (!) 174/88     Pulse Rate 03/14/19 1525 78     Resp 03/14/19 1525 18     Temp 03/14/19 1525 98.5 F (36.9 C)     Temp Source 03/14/19 1525 Oral     SpO2 03/14/19 1525 98 %     Weight 03/14/19 1525 178 lb (80.7 kg)     Height 03/14/19 1525 5\' 9"  (1.753 m)     Head Circumference --      Peak Flow --      Pain Score 03/14/19 1533 10     Pain Loc --      Pain Edu? --      Excl. in GC? --     Constitutional: Alert and oriented.  Uncomfortable appearing but in no acute distress. Eyes: Conjunctivae are normal.  Head: Atraumatic. Nose: No congestion/rhinnorhea. Mouth/Throat: Mucous membranes are moist.   Neck: Full range of motion to the left, limited to the right.  No midline cervical spinal tenderness.  Left paraspinal muscular tenderness in the C2-C3 region. Cardiovascular: Normal rate, regular rhythm. Good peripheral circulation. Respiratory: Normal respiratory effort.  No retractions. Gastrointestinal: No distention.  Musculoskeletal: No lower extremity edema.  Extremities warm and well perfused.  Neurologic:  Normal speech and language.  4.5/5 motor strength to the left upper extremity, otherwise normal neuro exam. Skin:  Skin is warm and dry. No rash noted. Psychiatric: Mood and affect are normal. Speech and behavior are normal.  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  63 year old male with PMH as noted above presents with a left neck pain radiating to left arm and associated with weakness and numbness.  It started acutely 8 days ago and has persisted since then despite multiple medications he has been taking.  I reviewed the past medical records in epic.  The patient was seen in the ED here on 11/18, with x-rays showing reversal of lordotic curvature consistent with a muscle spasm but no other acute findings.  [facility] although I am unable to see their notes.  He states he was told he had  a pinched nerve and needed an MRI, but states he could not wait for it to be scheduled.  I see that he also called the neurosurgery office and was offered an appointment in 2 weeks.  On exam the patient is uncomfortable appearing but in no distress.  He gets relief when he puts his left arm up over his head.  He does have left paraspinal cervical tenderness and very mildly decreased grip strength in the left arm.  He has numbness and tingling on the posterior arm following a roughly C6 dermatome distribution.  Neurologic exam is otherwise normal.  Overall presentation is most consistent with radiculopathy related to a possible disc problem.  The patient has no neurologic signs or symptoms to suggest vascular etiology such as vertebral artery dissection.  Differential includes muscle spasm.  Given the persistent pain with minimal relief from oral medications as well as the abnormal neurologic exam I will obtain an MRI to further evaluate.  ----------------------------------------- 10:09 PM on  03/14/2019 -----------------------------------------  The patient initially could not tolerate the position that was necessary for the MRI, so I gave 2 doses of Dilaudid.  He then was able to tolerate the exam.  MRI shows multilevel degenerative change with foraminal stenosis at several levels, but worst at C5-6 which corresponds with the patient's symptoms.  I consulted Dr. Adriana Simas from neurosurgery.  He recommends that we give the patient a Medrol dosepak and have him follow-up with him in the office tomorrow at 4 PM.   I discussed this with the patient and he agrees with the plan.  He states that his pain is manageable enough that with some additional pain medicine at home and starting the steroid, he is comfortable going home.  I will prescribe Percocet.  The patient states that he did have some hallucinatory type side effects with it when he took it several years ago, but he wants to try again given that the Norco is not totally controlling his pain.    I gave the patient very thorough return precautions and he expressed understanding.  He feels comfortable going home at this time. ____________________________________________   FINAL CLINICAL IMPRESSION(S) / ED DIAGNOSES  Final diagnoses:  Cervical radiculopathy      NEW MEDICATIONS STARTED DURING THIS VISIT:  New Prescriptions   METHYLPREDNISOLONE (MEDROL) 4 MG TABLET    6 tablets by mouth on day 1, 5 tabs on day 2, 4 tabs on day 3, 3 tabs on day 4, 2 tabs on day 5, 1 tab on day 6   OXYCODONE-ACETAMINOPHEN (PERCOCET) 5-325 MG TABLET    Take 1-2 tablets by mouth every 6 (six) hours as needed for up to 3 days for severe pain.     Note:  This document was prepared using Dragon voice recognition software and may include unintentional dictation errors.    Dionne Bucy, MD 03/14/19 2215

## 2019-03-14 NOTE — Discharge Instructions (Addendum)
Dr. Lacinda Axon has taken your information in his office should call you tomorrow to confirm your appointment for 4 PM.  If you do not hear from them by midday you should call the office yourself.  We have prescribed a Medrol Dosepak and Percocet.  It is very important that you start the Medrol Dosepak tomorrow and take it as prescribed.  Do not combine the Percocet with the Norco (hydrocodone) that you were prescribed previously.  You should only take 1 or the other.  Return to the ER for new or worsening severe pain, worsening numbness or weakness, or any other new or worsening symptoms that concern you.

## 2019-03-14 NOTE — ED Notes (Signed)
Pt otf for imaging 

## 2019-03-14 NOTE — ED Triage Notes (Addendum)
Pt reporting pain in neck radiating to his left arm. Left arm numbness also reported. No injury known. Pt reporting pain is worse when sitting. Pt pacing the room holding left arm up in the air.   Pt reporting the meds he was given here has not helped and pt went to [facility] and was given Percocet's, Ketorolac and Methocarbamol that have not helped either.   Pt refusing an EKG at this time.

## 2020-04-22 ENCOUNTER — Emergency Department (HOSPITAL_COMMUNITY)
Admission: EM | Admit: 2020-04-22 | Discharge: 2020-04-23 | Discharge status: Against Medical Advice | Payer: Commercial Managed Care - PPO | Attending: Emergency Medicine | Admitting: Emergency Medicine

## 2020-04-22 ENCOUNTER — Emergency Department (HOSPITAL_COMMUNITY): Payer: Commercial Managed Care - PPO

## 2020-04-22 DIAGNOSIS — U071 COVID-19: Secondary | ICD-10-CM | POA: Diagnosis not present

## 2020-04-22 DIAGNOSIS — Z794 Long term (current) use of insulin: Secondary | ICD-10-CM | POA: Insufficient documentation

## 2020-04-22 DIAGNOSIS — E119 Type 2 diabetes mellitus without complications: Secondary | ICD-10-CM | POA: Insufficient documentation

## 2020-04-22 DIAGNOSIS — R509 Fever, unspecified: Secondary | ICD-10-CM | POA: Diagnosis present

## 2020-04-22 MED ORDER — SODIUM CHLORIDE 0.9 % IV BOLUS
1000.0000 mL | Freq: Once | INTRAVENOUS | Status: AC
  Administered 2020-04-23: 1000 mL via INTRAVENOUS

## 2020-04-22 NOTE — ED Notes (Signed)
Pts. Pocket knife secured with security.

## 2020-04-22 NOTE — ED Triage Notes (Signed)
Pt. Arrived via EMS with complaints of weakness, fever and cough. Pts. Daughter states they thought pt was altered as well.

## 2020-04-22 NOTE — ED Provider Notes (Signed)
[facility] EMERGENCY DEPARTMENT Provider Note   CSN: [id number] Arrival date & time: 04/22/20  2132     History Chief Complaint  Patient presents with  . Dizziness    Jorge Gregory is a 65 y.o. male.  Patient is a 65 year old male with past medical history of hepatitis C, type 2 diabetes.  He presents today for evaluation of weakness and fever.  Patient reports a several day history of URI symptoms.  He was seen by his primary doctor for last week and prescribed amoxicillin for presumed bronchitis.  He seemed to be feeling better until this evening when he developed the sudden onset of weakness, dizziness, chills, and fever to 102.  He has no other symptoms such as sore throat, diarrhea, or urinary complaints.  The history is provided by the patient.  Dizziness Quality:  Lightheadedness and imbalance Severity:  Moderate Onset quality:  Sudden Timing:  Constant Progression:  Partially resolved Chronicity:  New Relieved by:  Nothing Worsened by:  Nothing Ineffective treatments:  None tried Associated symptoms comment:  Fever      Past Medical History:  Diagnosis Date  . Diabetes mellitus without complication ([facility])   . Hep C w/ coma, chronic     Patient Active Problem List   Diagnosis Date Noted  . HEPATITIS C 04/14/2007  . LIVER FUNCTION TESTS, ABNORMAL 02/25/2007  . 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 HYPERCHOLESTEROLEMIA, PURE 02/18/2007  . PRURITUS 02/18/2007  . SYMPTOM, MEMORY LOSS 02/18/2007    Past Surgical History:  Procedure Laterality Date  . CHOLECYSTECTOMY         No family history on file.  Social History   Tobacco Use  . Smoking status: Never Smoker  . Smokeless tobacco: Never Used  Substance Use Topics  . Alcohol use: No    Home Medications Prior to Admission medications   Medication Sig Start Date End Date Taking? Authorizing Provider  atorvastatin (LIPITOR) 10 MG tablet Take 10 mg by mouth daily.    [provider]  cyclobenzaprine  (FLEXERIL) 5 MG tablet Take 1-2 tablets 3 times daily as needed 03/08/19   Enid Derry, PA-C  enalapril (VASOTEC) 5 MG tablet Take 5 mg by mouth daily.    [provider]  ibuprofen (ADVIL) 600 MG tablet Take 1 tablet (600 mg total) by mouth every 6 (six) hours as needed. 03/08/19   Enid Derry, PA-C  insulin aspart (NOVOLOG) 100 UNIT/ML injection Inject into the skin 3 (three) times daily before meals.    [provider]  insulin glargine (LANTUS) 100 UNIT/ML injection Inject into the skin daily.    [provider]  lidocaine (LIDODERM) 5 % Place 1 patch onto the skin daily. Remove & Discard patch within 12 hours or as directed by MD 03/08/19   Enid Derry, PA-C  methylPREDNISolone (MEDROL) 4 MG tablet 6 tablets by mouth on day 1, 5 tabs on day 2, 4 tabs on day 3, 3 tabs on day 4, 2 tabs on day 5, 1 tab on day 6 03/14/19   Dionne Bucy, MD    Allergies    Fluoxetine hcl and Percocet [oxycodone-acetaminophen]  Review of Systems   Review of Systems  Neurological: Positive for dizziness.  All other systems reviewed and are negative.   Physical Exam Updated Vital Signs BP (!) 151/81 (BP Location: Right Arm)   Pulse 84   Temp 98.9 F (37.2 C) (Oral)   Resp 17   Ht 5\' 9"  (1.753 m)   Wt 75.3  kg   SpO2 97%   BMI 24.51 kg/m   Physical Exam Vitals and nursing note reviewed.  Constitutional:      General: He is not in acute distress.    Appearance: He is well-developed and well-nourished. He is not diaphoretic.  HENT:     Head: Normocephalic and atraumatic.     Mouth/Throat:     Mouth: Oropharynx is clear and moist.  Cardiovascular:     Rate and Rhythm: Normal rate and regular rhythm.     Heart sounds: No murmur heard. No friction rub.  Pulmonary:     Effort: Pulmonary effort is normal. No respiratory distress.     Breath sounds: Normal breath sounds. No wheezing or rales.  Abdominal:     General: Bowel sounds are normal. There is no  distension.     Palpations: Abdomen is soft.     Tenderness: There is no abdominal tenderness.  Musculoskeletal:        General: No edema. Normal range of motion.     Cervical back: Normal range of motion and neck supple.  Skin:    General: Skin is warm and dry.  Neurological:     Mental Status: He is alert and oriented to person, place, and time.     Coordination: Coordination normal.     ED Results / Procedures / Treatments   Labs (all labs ordered are listed, but only abnormal results are displayed) Labs Reviewed  URINE CULTURE  RESP PANEL BY RT-PCR (FLU A&B, COVID) [id number]  CULTURE, BLOOD (ROUTINE X 2)  CULTURE, BLOOD (ROUTINE X 2)  COMPREHENSIVE METABOLIC PANEL  CBC WITH DIFFERENTIAL/PLATELET  LACTIC ACID, PLASMA  LACTIC ACID, PLASMA  URINALYSIS, ROUTINE W REFLEX MICROSCOPIC    EKG None  Radiology No results found.  Procedures Procedures (including critical care time)  Medications Ordered in ED Medications  sodium chloride 0.9 % bolus 1,000 mL (has no administration in time range)    ED Course  I have reviewed the triage vital signs and the nursing notes.  Pertinent labs & imaging results that were available during my care of the patient were reviewed by me and considered in my medical decision making (see chart for details).    MDM Rules/Calculators/A&P  Patient presenting with complaints of cough and congestion for several days.  This is not improving with amoxicillin prescribed by his primary doctor.  This evening he became febrile, chilled, dizzy, and felt generally unwell and presents for evaluation of this.  Patient arrives here with no fever and stable vital signs.  Work-up was initiated to further evaluate the source of his fever including urinalysis, chest x-ray, Covid testing, and blood cultures.  These were obtained and are pending.  I was informed later that the patient's ride was in the parking lot and he had to leave.  He apparently signed out  AGAINST MEDICAL ADVICE before I had the chance to speak with him.  His Covid test later came back positive, but laboratory studies are otherwise unremarkable.  Patient was called by the RN and informed of his positive result.  Final Clinical Impression(s) / ED Diagnoses Final diagnoses:  None    Rx / DC Orders ED Discharge Orders    None       Geoffery Lyons, MD 04/23/20 [phone]

## 2020-04-23 LAB — COMPREHENSIVE METABOLIC PANEL
ALT: 21 U/L (ref 0–44)
AST: 19 U/L (ref 15–41)
Albumin: 4.7 g/dL (ref 3.5–5.0)
Alkaline Phosphatase: 50 U/L (ref 38–126)
Anion gap: 11 (ref 5–15)
BUN: 19 mg/dL (ref 8–23)
CO2: 25 mmol/L (ref 22–32)
Calcium: 9.3 mg/dL (ref 8.9–10.3)
Chloride: 98 mmol/L (ref 98–111)
Creatinine, Ser: 0.89 mg/dL (ref 0.61–1.24)
GFR, Estimated: >60 mL/min (ref >60)
Glucose, Bld: 195 mg/dL — ABNORMAL HIGH (ref 70–99)
Potassium: 3.8 mmol/L (ref 3.5–5.1)
Sodium: 134 mmol/L — ABNORMAL LOW (ref 135–145)
Total Bilirubin: 0.6 mg/dL (ref 0.3–1.2)
Total Protein: 8.3 g/dL — ABNORMAL HIGH (ref 6.5–8.1)

## 2020-04-23 LAB — URINALYSIS, ROUTINE W REFLEX MICROSCOPIC
Bilirubin Urine: NEGATIVE
Glucose, UA: NEGATIVE mg/dL
Hgb urine dipstick: NEGATIVE
Ketones, ur: NEGATIVE mg/dL
Leukocytes,Ua: NEGATIVE
Nitrite: NEGATIVE
Protein, ur: 30 mg/dL — AB
Specific Gravity, Urine: 1.013 (ref 1.005–1.030)
pH: 6 (ref 5.0–8.0)

## 2020-04-23 LAB — CBC WITH DIFFERENTIAL/PLATELET
Abs Immature Granulocytes: 0.04 10*3/uL (ref 0.00–0.07)
Basophils Absolute: 0 10*3/uL (ref 0.0–0.1)
Basophils Relative: 0 %
Eosinophils Absolute: 0.1 10*3/uL (ref 0.0–0.5)
Eosinophils Relative: 1 %
HCT: 45.7 % (ref 39.0–52.0)
Hemoglobin: 15.7 g/dL (ref 13.0–17.0)
Immature Granulocytes: 1 %
Lymphocytes Relative: 9 %
Lymphs Abs: 0.7 10*3/uL (ref 0.7–4.0)
MCH: 31.2 pg (ref 26.0–34.0)
MCHC: 34.4 g/dL (ref 30.0–36.0)
MCV: 90.9 fL (ref 80.0–100.0)
Monocytes Absolute: 0.7 10*3/uL (ref 0.1–1.0)
Monocytes Relative: 10 %
Neutro Abs: 6 10*3/uL (ref 1.7–7.7)
Neutrophils Relative %: 79 %
Platelets: 200 10*3/uL (ref 150–400)
RBC: 5.03 MIL/uL (ref 4.22–5.81)
RDW: 12.8 % (ref 11.5–15.5)
WBC: 7.6 10*3/uL (ref 4.0–10.5)
nRBC: 0 % (ref 0.0–0.2)

## 2020-04-23 LAB — RESP PANEL BY RT-PCR (FLU A&B, COVID) ARPGX2
Influenza A by PCR: NEGATIVE
Influenza B by PCR: NEGATIVE
SARS Coronavirus 2 by RT PCR: POSITIVE — AB

## 2020-04-23 LAB — LACTIC ACID, PLASMA: Lactic Acid, Venous: 1.5 mmol/L (ref 0.5–1.9)

## 2020-04-23 NOTE — ED Notes (Signed)
Date and time results received: 04/23/20 0135   Test: COVID Critical Value: POSITIVE  Name of Provider Notified: Judd Lien, MD

## 2020-04-23 NOTE — ED Notes (Signed)
This RN called Pt to inform him he is Covid Positive.  Pt provided instructions on quarantine protocol.

## 2020-04-24 LAB — URINE CULTURE: Culture: NO GROWTH

## 2020-04-28 LAB — CULTURE, BLOOD (ROUTINE X 2)
Culture: NO GROWTH
Culture: NO GROWTH
Special Requests: ADEQUATE
Special Requests: ADEQUATE

## 2020-12-03 ENCOUNTER — Other Ambulatory Visit: Payer: Self-pay | Admitting: Neurosurgery

## 2020-12-03 DIAGNOSIS — M5412 Radiculopathy, cervical region: Secondary | ICD-10-CM

## 2020-12-09 ENCOUNTER — Other Ambulatory Visit: Payer: Self-pay

## 2020-12-09 ENCOUNTER — Ambulatory Visit
Admission: RE | Admit: 2020-12-09 | Discharge: 2020-12-09 | Disposition: A | Discharge status: Home / Self Care | Payer: Commercial Managed Care - PPO | Source: Ambulatory Visit | Attending: Neurosurgery | Admitting: Neurosurgery

## 2020-12-09 DIAGNOSIS — M5412 Radiculopathy, cervical region: Secondary | ICD-10-CM | POA: Diagnosis present

## 2022-11-20 IMAGING — DX DG CHEST 1V PORT
1 series · 1 of 1 positions shown · non-contrast
Comparison: 04/08/2016

CLINICAL DATA: Fever, weakness

EXAM:
PORTABLE CHEST 1 VIEW

[chest ap]
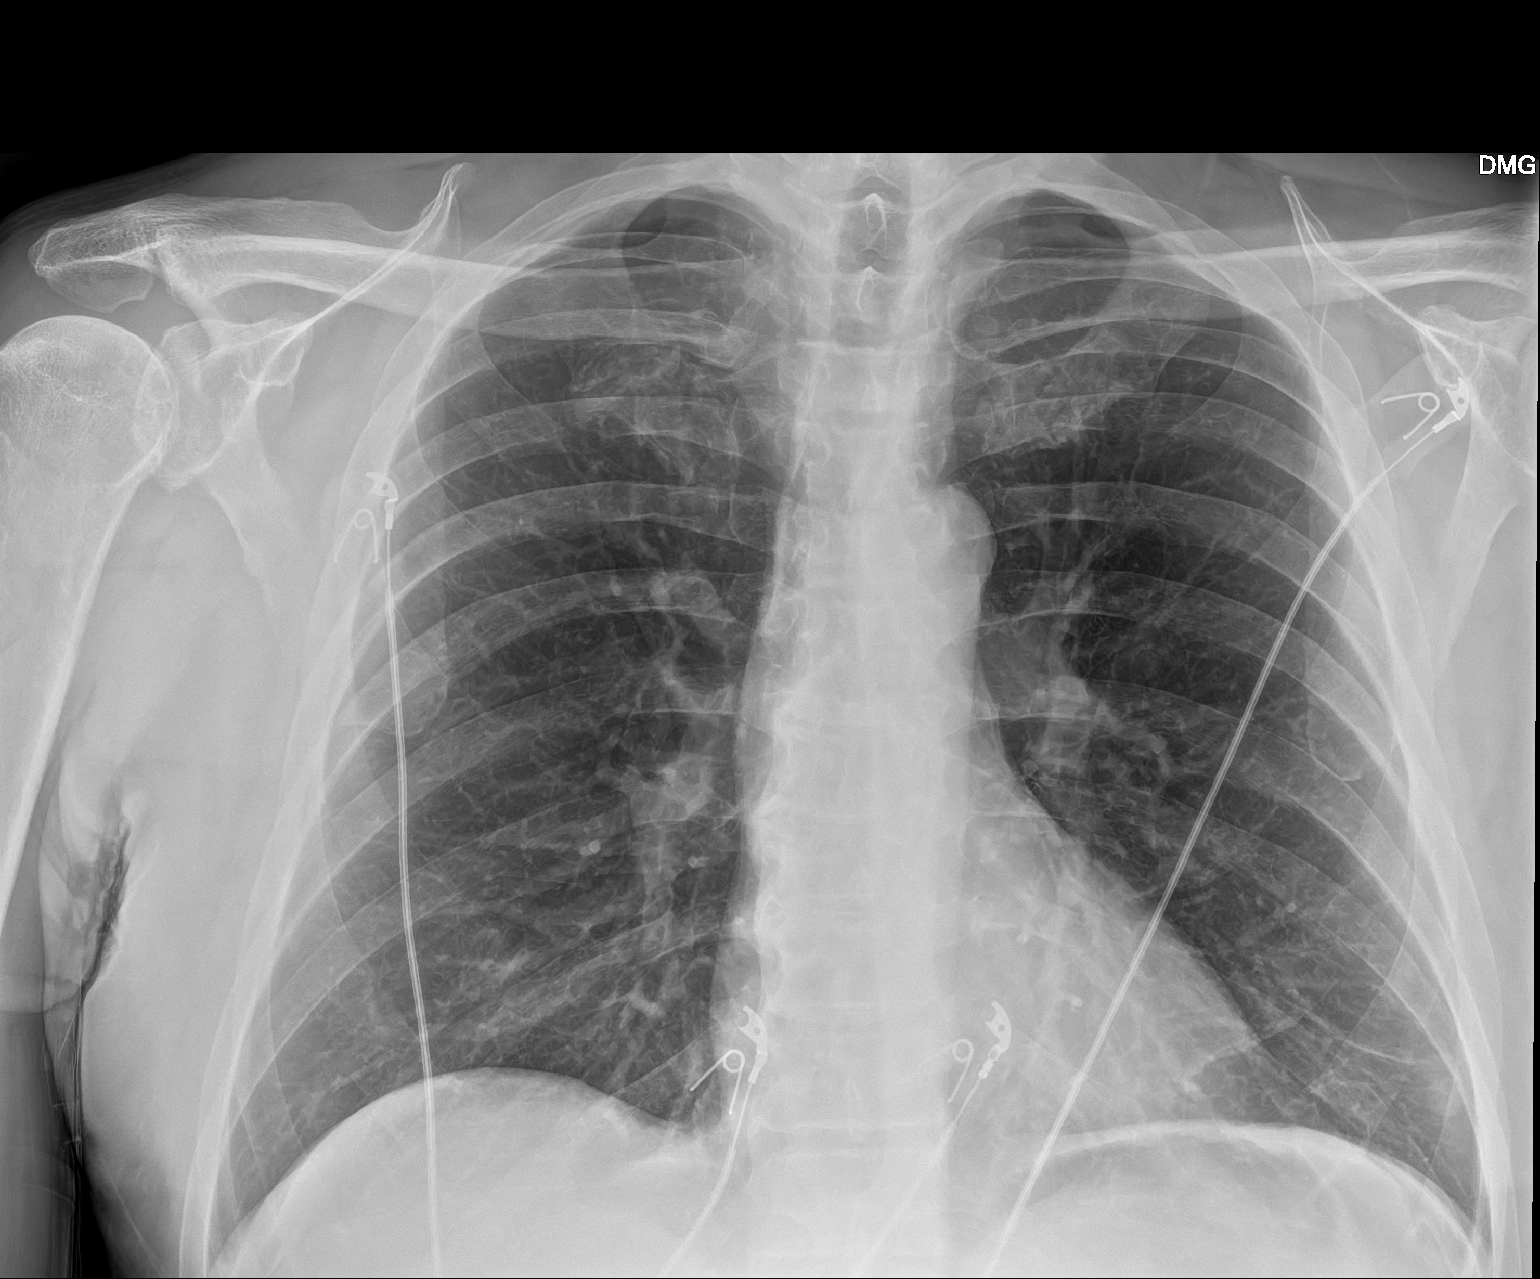

[1 of 1 positions shown; findings below may reference images not displayed]

FINDINGS: The heart size and mediastinal contours are within normal limits.
Both lungs are clear. The visualized skeletal structures are
unremarkable.
IMPRESSION: No active disease.

## 2023-03-12 ENCOUNTER — Other Ambulatory Visit: Payer: Self-pay | Admitting: Internal Medicine

## 2023-03-12 DIAGNOSIS — R0609 Other forms of dyspnea: Secondary | ICD-10-CM

## 2023-03-12 DIAGNOSIS — I200 Unstable angina: Secondary | ICD-10-CM

## 2023-03-13 ENCOUNTER — Encounter: Payer: Self-pay | Admitting: Internal Medicine

## 2023-03-23 ENCOUNTER — Ambulatory Visit (INDEPENDENT_AMBULATORY_CARE_PROVIDER_SITE_OTHER): Payer: Medicare HMO

## 2023-03-23 DIAGNOSIS — I200 Unstable angina: Secondary | ICD-10-CM | POA: Diagnosis not present

## 2023-03-23 DIAGNOSIS — R0609 Other forms of dyspnea: Secondary | ICD-10-CM

## 2023-03-23 MED ORDER — TECHNETIUM TC 99M SESTAMIBI GENERIC - CARDIOLITE
10.4000 | Freq: Once | INTRAVENOUS | Status: AC | PRN
  Administered 2023-03-23: 10.4 via INTRAVENOUS

## 2023-03-23 MED ORDER — TECHNETIUM TC 99M SESTAMIBI GENERIC - CARDIOLITE
33.7000 | Freq: Once | INTRAVENOUS | Status: AC | PRN
  Administered 2023-03-23: 33.7 via INTRAVENOUS

## 2023-03-24 ENCOUNTER — Encounter: Payer: Self-pay | Admitting: Cardiovascular Disease

## 2023-03-24 ENCOUNTER — Ambulatory Visit: Payer: Medicare HMO | Admitting: Cardiovascular Disease

## 2023-03-24 VITALS — BP 132/84 | HR 98 | Ht 68.0 in | Wt 180.4 lb

## 2023-03-24 DIAGNOSIS — R0602 Shortness of breath: Secondary | ICD-10-CM | POA: Diagnosis not present

## 2023-03-24 DIAGNOSIS — E7800 Pure hypercholesterolemia, unspecified: Secondary | ICD-10-CM

## 2023-03-24 DIAGNOSIS — E782 Mixed hyperlipidemia: Secondary | ICD-10-CM

## 2023-03-24 DIAGNOSIS — I10 Essential (primary) hypertension: Secondary | ICD-10-CM | POA: Diagnosis not present

## 2023-03-24 DIAGNOSIS — R42 Dizziness and giddiness: Secondary | ICD-10-CM

## 2023-03-24 DIAGNOSIS — E11628 Type 2 diabetes mellitus with other skin complications: Secondary | ICD-10-CM

## 2023-03-24 DIAGNOSIS — R0789 Other chest pain: Secondary | ICD-10-CM

## 2023-03-24 DIAGNOSIS — I259 Chronic ischemic heart disease, unspecified: Secondary | ICD-10-CM

## 2023-03-24 MED ORDER — ASPIRIN 81 MG PO TBEC: 81.0000 mg | DELAYED_RELEASE_TABLET | Freq: Every day | ORAL | 12 refills | Status: AC

## 2023-03-24 MED ORDER — METOPROLOL TARTRATE 100 MG PO TABS: ORAL_TABLET | ORAL | 2 refills | Status: DC

## 2023-03-24 MED ORDER — ISOSORBIDE MONONITRATE ER 30 MG PO TB24: 30.0000 mg | ORAL_TABLET | Freq: Every day | ORAL | 11 refills | Status: DC

## 2023-03-24 NOTE — Progress Notes (Signed)
Cardiology Office Note   Date:  03/24/2023   ID:  Jorge Gregory, DOB 03/01/1956, MRN [id number]  PCP:  Alan Mulder, MD  Cardiologist:  Adrian Blackwater, MD      History of Present Illness: Jorge Gregory is a 67 y.o. male who presents for No chief complaint on file.   Sent from Dr. Dario Guardian for chest pain and abnormal stress test  Chest Pain  This is a recurrent problem. The current episode started more than 1 month ago. The onset quality is gradual. The problem has been waxing and waning. The pain is at a severity of 6/10. The quality of the pain is described as dull. The pain radiates to the left jaw and right jaw. Associated symptoms include dizziness and shortness of breath. Pertinent negatives include no near-syncope. The pain is aggravated by exertion. He has tried rest for the symptoms. The treatment provided moderate relief.      Past Medical History:  Diagnosis Date   Diabetes mellitus without complication (HCC)    Hep C w/ coma, chronic      Past Surgical History:  Procedure Laterality Date   CHOLECYSTECTOMY       Current Outpatient Medications  Medication Sig Dispense Refill   aspirin EC 81 MG tablet Take 1 tablet (81 mg total) by mouth daily. Swallow whole. 30 tablet 12   citalopram (CELEXA) 10 MG tablet Take 20 mg by mouth daily.     isosorbide mononitrate (IMDUR) 30 MG 24 hr tablet Take 1 tablet (30 mg total) by mouth daily. 30 tablet 11   metoprolol tartrate (LOPRESSOR) 100 MG tablet Take 1 tab night before and 1 tab 90 minutes prior to CCTA 3 tablet 2   atorvastatin (LIPITOR) 10 MG tablet Take 10 mg by mouth daily.     cyclobenzaprine (FLEXERIL) 5 MG tablet Take 1-2 tablets 3 times daily as needed 20 tablet 0   enalapril (VASOTEC) 5 MG tablet Take 5 mg by mouth daily.     ibuprofen (ADVIL) 600 MG tablet Take 1 tablet (600 mg total) by mouth every 6 (six) hours as needed. 30 tablet 0   insulin aspart (NOVOLOG) 100 UNIT/ML injection Inject into the skin  3 (three) times daily before meals.     insulin glargine (LANTUS) 100 UNIT/ML injection Inject into the skin daily.     lidocaine (LIDODERM) 5 % Place 1 patch onto the skin daily. Remove & Discard patch within 12 hours or as directed by MD 30 patch 0   methylPREDNISolone (MEDROL) 4 MG tablet 6 tablets by mouth on day 1, 5 tabs on day 2, 4 tabs on day 3, 3 tabs on day 4, 2 tabs on day 5, 1 tab on day 6 21 tablet 0   No current facility-administered medications for this visit.    Allergies:   Fluoxetine hcl and Percocet [oxycodone-acetaminophen]    Social History:   reports that he has never smoked. He has never used smokeless tobacco. He reports that he does not drink alcohol. No history on file for drug use.   Family History:  family history is not on file.    ROS:     Review of Systems  Constitutional: Negative.   HENT: Negative.    Eyes: Negative.   Respiratory:  Positive for shortness of breath.   Cardiovascular:  Positive for chest pain. Negative for near-syncope.  Gastrointestinal: Negative.   Genitourinary: Negative.   Musculoskeletal: Negative.   Skin: Negative.  Neurological:  Positive for dizziness.  Endo/Heme/Allergies: Negative.   Psychiatric/Behavioral: Negative.    All other systems reviewed and are negative.     All other systems are reviewed and negative.    PHYSICAL EXAM: VS:  BP 132/84   Pulse 98   Ht 5\' 8"  (1.727 m)   Wt 180 lb 6.4 oz (81.8 kg)   SpO2 (!) 80%   BMI 27.43 kg/m  , BMI Body mass index is 27.43 kg/m. Last weight:  Wt Readings from Last 3 Encounters:  03/24/23 180 lb 6.4 oz (81.8 kg)  04/22/20 166 lb (75.3 kg)  03/14/19 178 lb (80.7 kg)     Physical Exam Vitals reviewed.  Constitutional:      Appearance: Normal appearance. He is normal weight.  HENT:     Head: Normocephalic.     Nose: Nose normal.     Mouth/Throat:     Mouth: Mucous membranes are moist.  Eyes:     Pupils: Pupils are equal, round, and reactive to light.   Cardiovascular:     Rate and Rhythm: Normal rate and regular rhythm.     Pulses: Normal pulses.     Heart sounds: Normal heart sounds.  Pulmonary:     Effort: Pulmonary effort is normal.  Abdominal:     General: Abdomen is flat. Bowel sounds are normal.  Musculoskeletal:        General: Normal range of motion.     Cervical back: Normal range of motion.  Skin:    General: Skin is warm.  Neurological:     General: No focal deficit present.     Mental Status: He is alert.  Psychiatric:        Mood and Affect: Mood normal.       EKG:   Recent Labs: No results found for requested labs within last 365 days.    Lipid Panel    Component Value Date/Time   CHOL 161 02/18/2007 1024   TRIG 86 02/18/2007 1024   HDL 38.0 (L) 02/18/2007 1024   CHOLHDL 4.2 CALC 02/18/2007 1024   VLDL 17 02/18/2007 1024   LDLCALC 106 (H) 02/18/2007 1024      Other studies Reviewed: Additional studies/ records that were reviewed today include:  Review of the above records demonstrates:       No data to display            ASSESSMENT AND PLAN:    ICD-10-CM   1. HYPERCHOLESTEROLEMIA, PURE  E78.00 CT CORONARY MORPH W/CTA COR W/SCORE W/CA W/CM &/OR WO/CM    PCV ECHOCARDIOGRAM COMPLETE    Comprehensive metabolic panel    metoprolol tartrate (LOPRESSOR) 100 MG tablet    isosorbide mononitrate (IMDUR) 30 MG 24 hr tablet    aspirin EC 81 MG tablet    2. Other chest pain  R07.89 CT CORONARY MORPH W/CTA COR W/SCORE W/CA W/CM &/OR WO/CM    PCV ECHOCARDIOGRAM COMPLETE    Comprehensive metabolic panel    metoprolol tartrate (LOPRESSOR) 100 MG tablet    isosorbide mononitrate (IMDUR) 30 MG 24 hr tablet    aspirin EC 81 MG tablet   Chest pain with symptoms suggestive of creshendo angina, nuclear stress test apical moderate to severe ischaemia, advise echo, CCTA    3. SOB (shortness of breath)  R06.02 CT CORONARY MORPH W/CTA COR W/SCORE W/CA W/CM &/OR WO/CM    PCV ECHOCARDIOGRAM COMPLETE     Comprehensive metabolic panel    metoprolol tartrate (LOPRESSOR) 100 MG tablet  isosorbide mononitrate (IMDUR) 30 MG 24 hr tablet    aspirin EC 81 MG tablet    4. Dizziness  R42 CT CORONARY MORPH W/CTA COR W/SCORE W/CA W/CM &/OR WO/CM    PCV ECHOCARDIOGRAM COMPLETE    Comprehensive metabolic panel    metoprolol tartrate (LOPRESSOR) 100 MG tablet    isosorbide mononitrate (IMDUR) 30 MG 24 hr tablet    aspirin EC 81 MG tablet    5. Mixed hyperlipidemia  E78.2 CT CORONARY MORPH W/CTA COR W/SCORE W/CA W/CM &/OR WO/CM    PCV ECHOCARDIOGRAM COMPLETE    Comprehensive metabolic panel    metoprolol tartrate (LOPRESSOR) 100 MG tablet    isosorbide mononitrate (IMDUR) 30 MG 24 hr tablet    aspirin EC 81 MG tablet    6. Primary hypertension  I10 CT CORONARY MORPH W/CTA COR W/SCORE W/CA W/CM &/OR WO/CM    PCV ECHOCARDIOGRAM COMPLETE    Comprehensive metabolic panel    metoprolol tartrate (LOPRESSOR) 100 MG tablet    isosorbide mononitrate (IMDUR) 30 MG 24 hr tablet    aspirin EC 81 MG tablet    7. 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 Chest pain due to myocardial ischemia, unspecified ischemic chest pain type  I25.9 CT CORONARY MORPH W/CTA COR W/SCORE W/CA W/CM &/OR WO/CM    PCV ECHOCARDIOGRAM COMPLETE    Comprehensive metabolic panel    metoprolol tartrate (LOPRESSOR) 100 MG tablet    isosorbide mononitrate (IMDUR) 30 MG 24 hr tablet    aspirin EC 81 MG tablet       Problem List Items Addressed This Visit       Other   HYPERCHOLESTEROLEMIA, PURE - Primary   Relevant Medications   metoprolol tartrate (LOPRESSOR) 100 MG tablet   isosorbide  mononitrate (IMDUR) 30 MG 24 hr tablet   aspirin EC 81 MG tablet   Other Relevant Orders   CT CORONARY MORPH W/CTA COR W/SCORE W/CA W/CM &/OR WO/CM   PCV ECHOCARDIOGRAM COMPLETE   Comprehensive metabolic panel   Other Visit Diagnoses     Other chest pain       Chest pain with symptoms suggestive of creshendo angina, nuclear stress test apical moderate to severe ischaemia, advise echo, CCTA   Relevant Medications   metoprolol tartrate (LOPRESSOR) 100 MG tablet   isosorbide mononitrate (IMDUR) 30 MG 24 hr tablet   aspirin EC 81 MG tablet   Other Relevant Orders   CT CORONARY MORPH W/CTA COR W/SCORE W/CA W/CM &/OR WO/CM   PCV ECHOCARDIOGRAM COMPLETE   Comprehensive metabolic panel   SOB (shortness of breath)       Relevant Medications   metoprolol tartrate (LOPRESSOR) 100 MG tablet   isosorbide mononitrate (IMDUR) 30 MG 24 hr tablet   aspirin EC 81 MG tablet   Other Relevant Orders   CT CORONARY MORPH W/CTA COR W/SCORE W/CA W/CM &/OR WO/CM   PCV ECHOCARDIOGRAM COMPLETE   Comprehensive metabolic panel   Dizziness       Relevant Medications   metoprolol tartrate (LOPRESSOR) 100 MG tablet   isosorbide mononitrate (IMDUR) 30 MG 24 hr tablet  aspirin EC 81 MG tablet   Other Relevant Orders   CT CORONARY MORPH W/CTA COR W/SCORE W/CA W/CM &/OR WO/CM   PCV ECHOCARDIOGRAM COMPLETE   Comprehensive metabolic panel   Mixed hyperlipidemia       Relevant Medications   metoprolol tartrate (LOPRESSOR) 100 MG tablet   isosorbide mononitrate (IMDUR) 30 MG 24 hr tablet   aspirin EC 81 MG tablet   Other Relevant Orders   CT CORONARY MORPH W/CTA COR W/SCORE W/CA W/CM &/OR WO/CM   PCV ECHOCARDIOGRAM COMPLETE   Comprehensive metabolic panel   Primary hypertension       Relevant Medications   metoprolol tartrate (LOPRESSOR) 100 MG tablet   isosorbide mononitrate (IMDUR) 30 MG 24 hr tablet   aspirin EC 81 MG tablet   Other Relevant Orders   CT CORONARY MORPH W/CTA COR W/SCORE W/CA W/CM &/OR  WO/CM   PCV ECHOCARDIOGRAM COMPLETE   Comprehensive metabolic panel   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, unspecified whether long term insulin use (HCC)       Relevant Medications   metoprolol tartrate (LOPRESSOR) 100 MG tablet   isosorbide mononitrate (IMDUR) 30 MG 24 hr tablet   aspirin EC 81 MG tablet   Other Relevant Orders   CT CORONARY MORPH W/CTA COR W/SCORE W/CA W/CM &/OR WO/CM   PCV ECHOCARDIOGRAM COMPLETE   Comprehensive metabolic panel   Chest pain due to myocardial ischemia, unspecified ischemic chest pain type       Relevant Medications   metoprolol tartrate (LOPRESSOR) 100 MG tablet   isosorbide mononitrate (IMDUR) 30 MG 24 hr tablet   aspirin EC 81 MG tablet   Other Relevant Orders   CT CORONARY MORPH W/CTA COR W/SCORE W/CA W/CM &/OR WO/CM   PCV ECHOCARDIOGRAM COMPLETE   Comprehensive metabolic panel          Disposition:   Return in about 2 weeks (around 04/07/2023) for echo, CCTA and f/u.    Total time spent: 30 minutes  Signed,  Adrian Blackwater, MD  03/24/2023 11:02 AM    [facility]

## 2023-03-31 ENCOUNTER — Ambulatory Visit: Payer: Medicare HMO

## 2023-03-31 ENCOUNTER — Other Ambulatory Visit: Payer: Self-pay | Admitting: Cardiovascular Disease

## 2023-03-31 ENCOUNTER — Other Ambulatory Visit: Payer: Self-pay

## 2023-03-31 DIAGNOSIS — E7800 Pure hypercholesterolemia, unspecified: Secondary | ICD-10-CM

## 2023-03-31 DIAGNOSIS — I259 Chronic ischemic heart disease, unspecified: Secondary | ICD-10-CM

## 2023-03-31 DIAGNOSIS — E11628 Type 2 diabetes mellitus with other skin complications: Secondary | ICD-10-CM

## 2023-03-31 DIAGNOSIS — R42 Dizziness and giddiness: Secondary | ICD-10-CM | POA: Diagnosis not present

## 2023-03-31 DIAGNOSIS — R072 Precordial pain: Secondary | ICD-10-CM | POA: Diagnosis not present

## 2023-03-31 DIAGNOSIS — R0602 Shortness of breath: Secondary | ICD-10-CM | POA: Diagnosis not present

## 2023-03-31 DIAGNOSIS — E782 Mixed hyperlipidemia: Secondary | ICD-10-CM

## 2023-03-31 DIAGNOSIS — R0789 Other chest pain: Secondary | ICD-10-CM

## 2023-03-31 DIAGNOSIS — I10 Essential (primary) hypertension: Secondary | ICD-10-CM

## 2023-03-31 MED ORDER — IOHEXOL 350 MG/ML SOLN
100.0000 mL | Freq: Once | INTRAVENOUS | Status: AC | PRN
  Administered 2023-03-31: 100 mL via INTRAVENOUS

## 2023-03-31 NOTE — Addendum Note (Signed)
Addended by: Adrian Blackwater A on: 03/31/2023 11:01 AM   Modules accepted: Orders

## 2023-04-01 LAB — CREATININE PICCOLO, WAIVED: Creatinine Piccolo, Waived: 1.1 mg/dL (ref 0.6–1.2)

## 2023-04-01 LAB — BUN PICCOLO, WAIVED: BUN Piccolo, Waived: 23 mg/dL — ABNORMAL HIGH (ref 7–22)

## 2023-04-08 ENCOUNTER — Ambulatory Visit: Payer: Medicare HMO

## 2023-04-08 DIAGNOSIS — R0789 Other chest pain: Secondary | ICD-10-CM

## 2023-04-08 DIAGNOSIS — I371 Nonrheumatic pulmonary valve insufficiency: Secondary | ICD-10-CM | POA: Diagnosis not present

## 2023-04-08 DIAGNOSIS — I259 Chronic ischemic heart disease, unspecified: Secondary | ICD-10-CM

## 2023-04-08 DIAGNOSIS — I351 Nonrheumatic aortic (valve) insufficiency: Secondary | ICD-10-CM

## 2023-04-08 DIAGNOSIS — I10 Essential (primary) hypertension: Secondary | ICD-10-CM

## 2023-04-08 DIAGNOSIS — E11628 Type 2 diabetes mellitus with other skin complications: Secondary | ICD-10-CM

## 2023-04-08 DIAGNOSIS — R0602 Shortness of breath: Secondary | ICD-10-CM

## 2023-04-08 DIAGNOSIS — E7800 Pure hypercholesterolemia, unspecified: Secondary | ICD-10-CM

## 2023-04-08 DIAGNOSIS — E782 Mixed hyperlipidemia: Secondary | ICD-10-CM

## 2023-04-08 DIAGNOSIS — R42 Dizziness and giddiness: Secondary | ICD-10-CM

## 2023-04-10 ENCOUNTER — Ambulatory Visit: Payer: Medicare HMO | Admitting: Cardiovascular Disease

## 2023-04-10 ENCOUNTER — Encounter: Payer: Self-pay | Admitting: Cardiovascular Disease

## 2023-04-10 VITALS — BP 130/84 | HR 74 | Ht 69.0 in | Wt 178.4 lb

## 2023-04-10 DIAGNOSIS — I110 Hypertensive heart disease with heart failure: Secondary | ICD-10-CM

## 2023-04-10 DIAGNOSIS — E782 Mixed hyperlipidemia: Secondary | ICD-10-CM | POA: Diagnosis not present

## 2023-04-10 DIAGNOSIS — I2584 Coronary atherosclerosis due to calcified coronary lesion: Secondary | ICD-10-CM

## 2023-04-10 DIAGNOSIS — R0789 Other chest pain: Secondary | ICD-10-CM

## 2023-04-10 DIAGNOSIS — I5022 Chronic systolic (congestive) heart failure: Secondary | ICD-10-CM

## 2023-04-10 DIAGNOSIS — I43 Cardiomyopathy in diseases classified elsewhere: Secondary | ICD-10-CM

## 2023-04-10 DIAGNOSIS — R0602 Shortness of breath: Secondary | ICD-10-CM | POA: Diagnosis not present

## 2023-04-10 DIAGNOSIS — I10 Essential (primary) hypertension: Secondary | ICD-10-CM

## 2023-04-10 DIAGNOSIS — I2510 Atherosclerotic heart disease of native coronary artery without angina pectoris: Secondary | ICD-10-CM

## 2023-04-10 MED ORDER — SACUBITRIL-VALSARTAN 24-26 MG PO TABS: 1.0000 | ORAL_TABLET | Freq: Two times a day (BID) | ORAL | 2 refills | Status: DC

## 2023-04-10 MED ORDER — METOPROLOL SUCCINATE ER 25 MG PO TB24
25.0000 mg | ORAL_TABLET | Freq: Every day | ORAL | 11 refills | Status: DC
Start: 2023-04-10 — End: 2023-10-26

## 2023-04-10 MED ORDER — ROSUVASTATIN CALCIUM 40 MG PO TABS: 40.0000 mg | ORAL_TABLET | Freq: Every day | ORAL | 2 refills | Status: DC

## 2023-04-10 NOTE — Progress Notes (Signed)
Cardiology Office Note   Date:  04/10/2023   ID:  Jorge Gregory, DOB 09/05/1955, MRN [id number]  PCP:  Alan Mulder, MD  Cardiologist:  Adrian Blackwater, MD      History of Present Illness: Jorge Gregory is a 67 y.o. male who presents for  Chief Complaint  Patient presents with   Follow-up    CCTA and Echo results    Feels fine      Past Medical History:  Diagnosis Date   Diabetes mellitus without complication (HCC)    Hep C w/ coma, chronic      Past Surgical History:  Procedure Laterality Date   CHOLECYSTECTOMY       Current Outpatient Medications  Medication Sig Dispense Refill   aspirin EC 81 MG tablet Take 1 tablet (81 mg total) by mouth daily. Swallow whole. 30 tablet 12   citalopram (CELEXA) 10 MG tablet Take 20 mg by mouth daily.     cyclobenzaprine (FLEXERIL) 5 MG tablet Take 1-2 tablets 3 times daily as needed 20 tablet 0   ergocalciferol (VITAMIN D2) 1.25 MG (50000 UT) capsule Take 50,000 Units by mouth once a week.     ibuprofen (ADVIL) 600 MG tablet Take 1 tablet (600 mg total) by mouth every 6 (six) hours as needed. 30 tablet 0   insulin aspart (NOVOLOG) 100 UNIT/ML injection Inject into the skin 3 (three) times daily before meals.     insulin glargine (LANTUS) 100 UNIT/ML injection Inject into the skin daily.     isosorbide mononitrate (IMDUR) 30 MG 24 hr tablet Take 1 tablet (30 mg total) by mouth daily. 30 tablet 11   lidocaine (LIDODERM) 5 % Place 1 patch onto the skin daily. Remove & Discard patch within 12 hours or as directed by MD 30 patch 0   loratadine (CLARITIN) 10 MG tablet Take 10 mg by mouth daily.     metoprolol succinate (TOPROL XL) 25 MG 24 hr tablet Take 1 tablet (25 mg total) by mouth daily. 30 tablet 11   Multiple Vitamin (MULTIVITAMIN) tablet Take 1 tablet by mouth daily.     oxyCODONE-acetaminophen (PERCOCET/ROXICET) 5-325 MG tablet Take 1 tablet by mouth every 4 (four) hours as needed for severe pain (pain score 7-10).      rosuvastatin (CRESTOR) 40 MG tablet Take 1 tablet (40 mg total) by mouth daily. 30 tablet 2   sacubitril-valsartan (ENTRESTO) 24-26 MG Take 1 tablet by mouth 2 (two) times daily. 60 tablet 2   sitaGLIPtin-metformin (JANUMET) 50-1000 MG tablet Take 1 tablet by mouth 2 (two) times daily with a meal.     methylPREDNISolone (MEDROL) 4 MG tablet 6 tablets by mouth on day 1, 5 tabs on day 2, 4 tabs on day 3, 3 tabs on day 4, 2 tabs on day 5, 1 tab on day 6 (Patient not taking: Reported on 04/10/2023) 21 tablet 0   No current facility-administered medications for this visit.    Allergies:   Fluoxetine hcl and Percocet [oxycodone-acetaminophen]    Social History:   reports that he has never smoked. He has never used smokeless tobacco. He reports that he does not drink alcohol. No history on file for drug use.   Family History:  family history is not on file.    ROS:     Review of Systems  Constitutional: Negative.   HENT: Negative.    Eyes: Negative.   Respiratory: Negative.    Gastrointestinal: Negative.   Genitourinary: Negative.  Musculoskeletal: Negative.   Skin: Negative.   Neurological: Negative.   Endo/Heme/Allergies: Negative.   Psychiatric/Behavioral: Negative.    All other systems reviewed and are negative.     All other systems are reviewed and negative.    PHYSICAL EXAM: VS:  BP 130/84   Pulse 74   Ht 5\' 9"  (1.753 m)   Wt 178 lb 6.4 oz (80.9 kg)   SpO2 97%   BMI 26.35 kg/m  , BMI Body mass index is 26.35 kg/m. Last weight:  Wt Readings from Last 3 Encounters:  04/10/23 178 lb 6.4 oz (80.9 kg)  03/24/23 180 lb 6.4 oz (81.8 kg)  04/22/20 166 lb (75.3 kg)     Physical Exam Vitals reviewed.  Constitutional:      Appearance: Normal appearance. He is normal weight.  HENT:     Head: Normocephalic.     Nose: Nose normal.     Mouth/Throat:     Mouth: Mucous membranes are moist.  Eyes:     Pupils: Pupils are equal, round, and reactive to light.   Cardiovascular:     Rate and Rhythm: Normal rate and regular rhythm.     Pulses: Normal pulses.     Heart sounds: Normal heart sounds.  Pulmonary:     Effort: Pulmonary effort is normal.  Abdominal:     General: Abdomen is flat. Bowel sounds are normal.  Musculoskeletal:        General: Normal range of motion.     Cervical back: Normal range of motion.  Skin:    General: Skin is warm.  Neurological:     General: No focal deficit present.     Mental Status: He is alert.  Psychiatric:        Mood and Affect: Mood normal.       EKG:   Recent Labs: 03/31/2023: BUN Piccolo, Waived 23; Creatinine Piccolo, Waived 1.1    Lipid Panel    Component Value Date/Time   CHOL 161 02/18/2007 1024   TRIG 86 02/18/2007 1024   HDL 38.0 (L) 02/18/2007 1024   CHOLHDL 4.2 CALC 02/18/2007 1024   VLDL 17 02/18/2007 1024   LDLCALC 106 (H) 02/18/2007 1024      Other studies Reviewed: Additional studies/ records that were reviewed today include:  Review of the above records demonstrates:       No data to display            ASSESSMENT AND PLAN:    ICD-10-CM   1. Mixed hyperlipidemia  E78.2 sacubitril-valsartan (ENTRESTO) 24-26 MG    metoprolol succinate (TOPROL XL) 25 MG 24 hr tablet    rosuvastatin (CRESTOR) 40 MG tablet    2. Primary hypertension  I10 sacubitril-valsartan (ENTRESTO) 24-26 MG    metoprolol succinate (TOPROL XL) 25 MG 24 hr tablet    rosuvastatin (CRESTOR) 40 MG tablet    3. SOB (shortness of breath)  R06.02 sacubitril-valsartan (ENTRESTO) 24-26 MG    metoprolol succinate (TOPROL XL) 25 MG 24 hr tablet    rosuvastatin (CRESTOR) 40 MG tablet    4. Other chest pain  R07.89 sacubitril-valsartan (ENTRESTO) 24-26 MG    metoprolol succinate (TOPROL XL) 25 MG 24 hr tablet    rosuvastatin (CRESTOR) 40 MG tablet    5. Shortness of breath  R06.02 sacubitril-valsartan (ENTRESTO) 24-26 MG    metoprolol succinate (TOPROL XL) 25 MG 24 hr tablet    rosuvastatin  (CRESTOR) 40 MG tablet    6. CHF (congestive heart failure), NYHA  class I, chronic, systolic (HCC)  I50.22 sacubitril-valsartan (ENTRESTO) 24-26 MG    metoprolol succinate (TOPROL XL) 25 MG 24 hr tablet    rosuvastatin (CRESTOR) 40 MG tablet    7. Cardiomyopathy due to hypertension, with heart failure (HCC)  I11.0 sacubitril-valsartan (ENTRESTO) 24-26 MG   I43 metoprolol succinate (TOPROL XL) 25 MG 24 hr tablet    rosuvastatin (CRESTOR) 40 MG tablet   LVEF 45 %, on echo, mild CAD, advise changing losatan 25 to entresto and add metoprolol sucinate 25 daily    8. Coronary artery disease due to calcified coronary lesion  I25.10 sacubitril-valsartan (ENTRESTO) 24-26 MG   I25.84 metoprolol succinate (TOPROL XL) 25 MG 24 hr tablet    rosuvastatin (CRESTOR) 40 MG tablet   Ca score 187 on ccta with mild CAD,, advise crestor 40, instead of simvistatin 80       Problem List Items Addressed This Visit   None Visit Diagnoses       Mixed hyperlipidemia    -  Primary   Relevant Medications   sacubitril-valsartan (ENTRESTO) 24-26 MG   metoprolol succinate (TOPROL XL) 25 MG 24 hr tablet   rosuvastatin (CRESTOR) 40 MG tablet     Primary hypertension       Relevant Medications   sacubitril-valsartan (ENTRESTO) 24-26 MG   metoprolol succinate (TOPROL XL) 25 MG 24 hr tablet   rosuvastatin (CRESTOR) 40 MG tablet     SOB (shortness of breath)       Relevant Medications   sacubitril-valsartan (ENTRESTO) 24-26 MG   metoprolol succinate (TOPROL XL) 25 MG 24 hr tablet   rosuvastatin (CRESTOR) 40 MG tablet     Other chest pain       Relevant Medications   sacubitril-valsartan (ENTRESTO) 24-26 MG   metoprolol succinate (TOPROL XL) 25 MG 24 hr tablet   rosuvastatin (CRESTOR) 40 MG tablet     Shortness of breath       Relevant Medications   sacubitril-valsartan (ENTRESTO) 24-26 MG   metoprolol succinate (TOPROL XL) 25 MG 24 hr tablet   rosuvastatin (CRESTOR) 40 MG tablet     CHF (congestive heart  failure), NYHA class I, chronic, systolic (HCC)       Relevant Medications   sacubitril-valsartan (ENTRESTO) 24-26 MG   metoprolol succinate (TOPROL XL) 25 MG 24 hr tablet   rosuvastatin (CRESTOR) 40 MG tablet     Cardiomyopathy due to hypertension, with heart failure (HCC)       LVEF 45 %, on echo, mild CAD, advise changing losatan 25 to entresto and add metoprolol sucinate 25 daily   Relevant Medications   sacubitril-valsartan (ENTRESTO) 24-26 MG   metoprolol succinate (TOPROL XL) 25 MG 24 hr tablet   rosuvastatin (CRESTOR) 40 MG tablet     Coronary artery disease due to calcified coronary lesion       Ca score 187 on ccta with mild CAD,, advise crestor 40, instead of simvistatin 80   Relevant Medications   sacubitril-valsartan (ENTRESTO) 24-26 MG   metoprolol succinate (TOPROL XL) 25 MG 24 hr tablet   rosuvastatin (CRESTOR) 40 MG tablet          Disposition:   Return in about 2 months (around 06/11/2023).    Total time spent: 50 minutes  Signed,  Adrian Blackwater, MD  04/10/2023 10:47 AM    [facility]

## 2023-05-04 ENCOUNTER — Telehealth: Payer: Self-pay | Admitting: Cardiovascular Disease

## 2023-05-04 NOTE — Telephone Encounter (Signed)
 Patient started taking one Entresto daily then realized he was supposed to be taking it BID. When started it BID he is having GI issue, dizziness and unable to eat. Wants to know if this is normal for Entresto side effects? Please advise.

## 2023-06-05 ENCOUNTER — Ambulatory Visit: Payer: PPO | Admitting: Cardiovascular Disease

## 2023-06-05 ENCOUNTER — Encounter: Payer: Self-pay | Admitting: Cardiovascular Disease

## 2023-06-05 VITALS — BP 154/78 | HR 74 | Ht 69.0 in | Wt 179.0 lb

## 2023-06-05 DIAGNOSIS — R42 Dizziness and giddiness: Secondary | ICD-10-CM

## 2023-06-05 DIAGNOSIS — I110 Hypertensive heart disease with heart failure: Secondary | ICD-10-CM

## 2023-06-05 DIAGNOSIS — E782 Mixed hyperlipidemia: Secondary | ICD-10-CM

## 2023-06-05 DIAGNOSIS — I10 Essential (primary) hypertension: Secondary | ICD-10-CM

## 2023-06-05 DIAGNOSIS — R0602 Shortness of breath: Secondary | ICD-10-CM

## 2023-06-05 DIAGNOSIS — I2510 Atherosclerotic heart disease of native coronary artery without angina pectoris: Secondary | ICD-10-CM

## 2023-06-05 DIAGNOSIS — R0789 Other chest pain: Secondary | ICD-10-CM

## 2023-06-05 DIAGNOSIS — I43 Cardiomyopathy in diseases classified elsewhere: Secondary | ICD-10-CM

## 2023-06-05 MED ORDER — LOSARTAN POTASSIUM 50 MG PO TABS: 50.0000 mg | ORAL_TABLET | Freq: Two times a day (BID) | ORAL | 11 refills | Status: DC

## 2023-06-05 MED ORDER — LOSARTAN POTASSIUM 25 MG PO TABS: 25.0000 mg | ORAL_TABLET | Freq: Every day | ORAL | 1 refills | Status: DC

## 2023-06-05 NOTE — Progress Notes (Signed)
Cardiology Office Note   Date:  06/05/2023   ID:  Jorge Gregory, DOB Aug 22, 1955, MRN [id number]  PCP:  Alan Mulder, MD  Cardiologist:  Adrian Blackwater, MD      History of Present Illness: Jorge Gregory is a 68 y.o. male who presents for  Chief Complaint  Patient presents with   Follow-up    2 Months Follow Up    Doing well      Past Medical History:  Diagnosis Date   Diabetes mellitus without complication (HCC)    Hep C w/ coma, chronic      Past Surgical History:  Procedure Laterality Date   CHOLECYSTECTOMY       Current Outpatient Medications  Medication Sig Dispense Refill   aspirin EC 81 MG tablet Take 1 tablet (81 mg total) by mouth daily. Swallow whole. 30 tablet 12   ergocalciferol (VITAMIN D2) 1.25 MG (50000 UT) capsule Take 50,000 Units by mouth once a week.     insulin aspart (NOVOLOG) 100 UNIT/ML injection Inject into the skin 3 (three) times daily before meals.     loratadine (CLARITIN) 10 MG tablet Take 10 mg by mouth daily.     losartan (COZAAR) 50 MG tablet Take 1 tablet (50 mg total) by mouth 2 (two) times daily. 60 tablet 11   metoprolol succinate (TOPROL XL) 25 MG 24 hr tablet Take 1 tablet (25 mg total) by mouth daily. 30 tablet 11   Multiple Vitamin (MULTIVITAMIN) tablet Take 1 tablet by mouth daily.     rosuvastatin (CRESTOR) 40 MG tablet Take 1 tablet (40 mg total) by mouth daily. 30 tablet 2   sitaGLIPtin-metformin (JANUMET) 50-1000 MG tablet Take 1 tablet by mouth 2 (two) times daily with a meal.     insulin glargine (LANTUS) 100 UNIT/ML injection Inject into the skin daily.     lidocaine (LIDODERM) 5 % Place 1 patch onto the skin daily. Remove & Discard patch within 12 hours or as directed by MD 30 patch 0   No current facility-administered medications for this visit.    Allergies:   Fluoxetine hcl and Percocet [oxycodone-acetaminophen]    Social History:   reports that he has never smoked. He has never used smokeless tobacco.  He reports that he does not drink alcohol. No history on file for drug use.   Family History:  family history is not on file.    ROS:     Review of Systems  Constitutional: Negative.   HENT: Negative.    Eyes: Negative.   Respiratory: Negative.    Gastrointestinal: Negative.   Genitourinary: Negative.   Musculoskeletal: Negative.   Skin: Negative.   Neurological: Negative.   Endo/Heme/Allergies: Negative.   Psychiatric/Behavioral: Negative.    All other systems reviewed and are negative.     All other systems are reviewed and negative.    PHYSICAL EXAM: VS:  BP (!) 154/78   Pulse 74   Ht 5\' 9"  (1.753 m)   Wt 179 lb (81.2 kg)   SpO2 96%   BMI 26.43 kg/m  , BMI Body mass index is 26.43 kg/m. Last weight:  Wt Readings from Last 3 Encounters:  06/05/23 179 lb (81.2 kg)  04/10/23 178 lb 6.4 oz (80.9 kg)  03/24/23 180 lb 6.4 oz (81.8 kg)     Physical Exam Vitals reviewed.  Constitutional:      Appearance: Normal appearance. He is normal weight.  HENT:     Head: Normocephalic.  Nose: Nose normal.     Mouth/Throat:     Mouth: Mucous membranes are moist.  Eyes:     Pupils: Pupils are equal, round, and reactive to light.  Cardiovascular:     Rate and Rhythm: Normal rate and regular rhythm.     Pulses: Normal pulses.     Heart sounds: Normal heart sounds.  Pulmonary:     Effort: Pulmonary effort is normal.  Abdominal:     General: Abdomen is flat. Bowel sounds are normal.  Musculoskeletal:        General: Normal range of motion.     Cervical back: Normal range of motion.  Skin:    General: Skin is warm.  Neurological:     General: No focal deficit present.     Mental Status: He is alert.  Psychiatric:        Mood and Affect: Mood normal.       EKG:   Recent Labs: 03/31/2023: BUN Piccolo, Waived 23; Creatinine Piccolo, Waived 1.1    Lipid Panel    Component Value Date/Time   CHOL 161 02/18/2007 1024   TRIG 86 02/18/2007 1024   HDL 38.0 (L)  02/18/2007 1024   CHOLHDL 4.2 CALC 02/18/2007 1024   VLDL 17 02/18/2007 1024   LDLCALC 106 (H) 02/18/2007 1024      Other studies Reviewed: Additional studies/ records that were reviewed today include:  Review of the above records demonstrates:       No data to display            ASSESSMENT AND PLAN:    ICD-10-CM   1. Cardiomyopathy due to hypertension, with heart failure (HCC)  I11.0    I43    LVEF 45 %, on echo, unable to tolerate entresto, so placed on losartan 50    2. Mixed hyperlipidemia  E78.2 losartan (COZAAR) 50 MG tablet    DISCONTINUED: losartan (COZAAR) 25 MG tablet    3. Primary hypertension  I10 losartan (COZAAR) 50 MG tablet    DISCONTINUED: losartan (COZAAR) 25 MG tablet   Stopped entresto, so being put on losartan 50    4. Shortness of breath  R06.02 losartan (COZAAR) 50 MG tablet    DISCONTINUED: losartan (COZAAR) 25 MG tablet    5. Other chest pain  R07.89 losartan (COZAAR) 50 MG tablet    DISCONTINUED: losartan (COZAAR) 25 MG tablet    6. SOB (shortness of breath)  R06.02 losartan (COZAAR) 50 MG tablet    DISCONTINUED: losartan (COZAAR) 25 MG tablet    7. Dizziness  R42 losartan (COZAAR) 50 MG tablet    DISCONTINUED: losartan (COZAAR) 25 MG tablet    8. Coronary artery disease involving native coronary artery of native heart without angina pectoris  I25.10    CCTA showed ca score 181, with no significant disease, just minor irregularties       Problem List Items Addressed This Visit   None Visit Diagnoses       Cardiomyopathy due to hypertension, with heart failure (HCC)    -  Primary   LVEF 45 %, on echo, unable to tolerate entresto, so placed on losartan 50   Relevant Medications   losartan (COZAAR) 50 MG tablet     Mixed hyperlipidemia       Relevant Medications   losartan (COZAAR) 50 MG tablet     Primary hypertension       Stopped entresto, so being put on losartan 50   Relevant Medications  losartan (COZAAR) 50 MG tablet      Shortness of breath       Relevant Medications   losartan (COZAAR) 50 MG tablet     Other chest pain       Relevant Medications   losartan (COZAAR) 50 MG tablet     SOB (shortness of breath)       Relevant Medications   losartan (COZAAR) 50 MG tablet     Dizziness       Relevant Medications   losartan (COZAAR) 50 MG tablet     Coronary artery disease involving native coronary artery of native heart without angina pectoris       CCTA showed ca score 181, with no significant disease, just minor irregularties   Relevant Medications   losartan (COZAAR) 50 MG tablet          Disposition:   Return in about 5 weeks (around 07/10/2023).    Total time spent: 35 minutes  Signed,  Adrian Blackwater, MD  06/05/2023 10:34 AM    [facility]

## 2023-06-12 ENCOUNTER — Ambulatory Visit: Payer: Medicare HMO | Admitting: Cardiovascular Disease

## 2023-07-10 ENCOUNTER — Ambulatory Visit: Payer: PPO | Admitting: Cardiovascular Disease

## 2023-07-13 ENCOUNTER — Ambulatory Visit (INDEPENDENT_AMBULATORY_CARE_PROVIDER_SITE_OTHER): Admitting: Cardiovascular Disease

## 2023-07-13 ENCOUNTER — Encounter: Payer: Self-pay | Admitting: Cardiovascular Disease

## 2023-07-13 VITALS — BP 122/73 | HR 75 | Ht 69.0 in | Wt 183.0 lb

## 2023-07-13 DIAGNOSIS — I10 Essential (primary) hypertension: Secondary | ICD-10-CM | POA: Diagnosis not present

## 2023-07-13 DIAGNOSIS — R0602 Shortness of breath: Secondary | ICD-10-CM

## 2023-07-13 DIAGNOSIS — I43 Cardiomyopathy in diseases classified elsewhere: Secondary | ICD-10-CM

## 2023-07-13 DIAGNOSIS — R0789 Other chest pain: Secondary | ICD-10-CM

## 2023-07-13 DIAGNOSIS — I110 Hypertensive heart disease with heart failure: Secondary | ICD-10-CM

## 2023-07-13 DIAGNOSIS — I2510 Atherosclerotic heart disease of native coronary artery without angina pectoris: Secondary | ICD-10-CM

## 2023-07-13 DIAGNOSIS — E782 Mixed hyperlipidemia: Secondary | ICD-10-CM | POA: Diagnosis not present

## 2023-07-13 NOTE — Progress Notes (Signed)
 Cardiology Office Note   Date:  07/13/2023   ID:  Jorge Gregory, DOB [birth year], MRN [id number]  PCP:  Alan Mulder, MD  Cardiologist:  Adrian Blackwater, MD      History of Present Illness: Jorge Gregory is a 68 y.o. male who presents for  Chief Complaint  Patient presents with   Follow-up    5 week follow up     Intermittant jaw pains, sits down and go  away.      Past Medical History:  Diagnosis Date   Diabetes mellitus without complication (HCC)    Hep C w/ coma, chronic      Past Surgical History:  Procedure Laterality Date   CHOLECYSTECTOMY       Current Outpatient Medications  Medication Sig Dispense Refill   aspirin EC 81 MG tablet Take 1 tablet (81 mg total) by mouth daily. Swallow whole. 30 tablet 12   ergocalciferol (VITAMIN D2) 1.25 MG (50000 UT) capsule Take 50,000 Units by mouth once a week.     insulin aspart (NOVOLOG) 100 UNIT/ML injection Inject into the skin 3 (three) times daily before meals.     loratadine (CLARITIN) 10 MG tablet Take 10 mg by mouth daily.     losartan (COZAAR) 50 MG tablet Take 1 tablet (50 mg total) by mouth 2 (two) times daily. 60 tablet 11   metoprolol succinate (TOPROL XL) 25 MG 24 hr tablet Take 1 tablet (25 mg total) by mouth daily. 30 tablet 11   Multiple Vitamin (MULTIVITAMIN) tablet Take 1 tablet by mouth daily.     rosuvastatin (CRESTOR) 40 MG tablet Take 1 tablet (40 mg total) by mouth daily. 30 tablet 2   sitaGLIPtin-metformin (JANUMET) 50-1000 MG tablet Take 1 tablet by mouth 2 (two) times daily with a meal.     insulin glargine (LANTUS) 100 UNIT/ML injection Inject into the skin daily. (Patient not taking: Reported on 07/13/2023)     lidocaine (LIDODERM) 5 % Place 1 patch onto the skin daily. Remove & Discard patch within 12 hours or as directed by MD (Patient not taking: Reported on 07/13/2023) 30 patch 0   traZODone (DESYREL) 50 MG tablet Take 50 mg by mouth at bedtime.     No current facility-administered  medications for this visit.    Allergies:   Fluoxetine hcl and Percocet [oxycodone-acetaminophen]    Social History:   reports that he has never smoked. He has never used smokeless tobacco. He reports that he does not drink alcohol. No history on file for drug use.   Family History:  family history is not on file.    ROS:     Review of Systems  Constitutional: Negative.   HENT: Negative.    Eyes: Negative.   Respiratory: Negative.    Gastrointestinal: Negative.   Genitourinary: Negative.   Musculoskeletal: Negative.   Skin: Negative.   Neurological: Negative.   Endo/Heme/Allergies: Negative.   Psychiatric/Behavioral: Negative.    All other systems reviewed and are negative.     All other systems are reviewed and negative.    PHYSICAL EXAM: VS:  BP 122/73   Pulse 75   Ht 5\' 9"  (1.753 m)   Wt 183 lb (83 kg)   SpO2 98%   BMI 27.02 kg/m  , BMI Body mass index is 27.02 kg/m. Last weight:  Wt Readings from Last 3 Encounters:  07/13/23 183 lb (83 kg)  06/05/23 179 lb (81.2 kg)  04/10/23 178 lb 6.4 oz (  80.9 kg)     Physical Exam Vitals reviewed.  Constitutional:      Appearance: Normal appearance. He is normal weight.  HENT:     Head: Normocephalic.     Nose: Nose normal.     Mouth/Throat:     Mouth: Mucous membranes are moist.  Eyes:     Pupils: Pupils are equal, round, and reactive to light.  Cardiovascular:     Rate and Rhythm: Normal rate and regular rhythm.     Pulses: Normal pulses.     Heart sounds: Normal heart sounds.  Pulmonary:     Effort: Pulmonary effort is normal.  Abdominal:     General: Abdomen is flat. Bowel sounds are normal.  Musculoskeletal:        General: Normal range of motion.     Cervical back: Normal range of motion.  Skin:    General: Skin is warm.  Neurological:     General: No focal deficit present.     Mental Status: He is alert.  Psychiatric:        Mood and Affect: Mood normal.       EKG:   Recent  Labs: 03/31/2023: BUN Piccolo, Waived 23; Creatinine Piccolo, Waived 1.1    Lipid Panel    Component Value Date/Time   CHOL 161 02/18/2007 1024   TRIG 86 02/18/2007 1024   HDL 38.0 (L) 02/18/2007 1024   CHOLHDL 4.2 CALC 02/18/2007 1024   VLDL 17 02/18/2007 1024   LDLCALC 106 (H) 02/18/2007 1024      Other studies Reviewed: Additional studies/ records that were reviewed today include:  Review of the above records demonstrates:       No data to display            ASSESSMENT AND PLAN:    ICD-10-CM   1. Coronary artery disease involving native coronary artery of native heart without angina pectoris  I25.10    CCTA only had minor irregularitis.    2. Mixed hyperlipidemia  E78.2     3. Primary hypertension  I10     4. Other chest pain  R07.89    Atypical chest pains.    5. SOB (shortness of breath)  R06.02     6. Cardiomyopathy due to hypertension, with heart failure (HCC)  I11.0    I43        Problem List Items Addressed This Visit   None Visit Diagnoses       Coronary artery disease involving native coronary artery of native heart without angina pectoris    -  Primary   CCTA only had minor irregularitis.     Mixed hyperlipidemia         Primary hypertension         Other chest pain       Atypical chest pains.     SOB (shortness of breath)         Cardiomyopathy due to hypertension, with heart failure (HCC)              Disposition:   Return in about 3 months (around 10/13/2023).    Total time spent: 30 minutes  Signed,  Adrian Blackwater, MD  07/13/2023 10:32 AM    [facility]

## 2023-07-28 ENCOUNTER — Ambulatory Visit: Payer: Commercial Managed Care - PPO | Admitting: Cardiovascular Disease

## 2023-07-30 ENCOUNTER — Encounter: Payer: Self-pay | Admitting: Cardiovascular Disease

## 2023-07-30 ENCOUNTER — Ambulatory Visit: Admitting: Cardiovascular Disease

## 2023-07-30 ENCOUNTER — Ambulatory Visit: Payer: Self-pay | Admitting: Cardiovascular Disease

## 2023-07-30 VITALS — BP 140/78 | HR 56 | Ht 69.0 in | Wt 180.0 lb

## 2023-07-30 DIAGNOSIS — E1159 Type 2 diabetes mellitus with other circulatory complications: Secondary | ICD-10-CM | POA: Insufficient documentation

## 2023-07-30 DIAGNOSIS — R0602 Shortness of breath: Secondary | ICD-10-CM | POA: Insufficient documentation

## 2023-07-30 DIAGNOSIS — I25119 Atherosclerotic heart disease of native coronary artery with unspecified angina pectoris: Secondary | ICD-10-CM | POA: Insufficient documentation

## 2023-07-30 DIAGNOSIS — I25110 Atherosclerotic heart disease of native coronary artery with unstable angina pectoris: Secondary | ICD-10-CM

## 2023-07-30 DIAGNOSIS — I43 Cardiomyopathy in diseases classified elsewhere: Secondary | ICD-10-CM

## 2023-07-30 DIAGNOSIS — I2510 Atherosclerotic heart disease of native coronary artery without angina pectoris: Secondary | ICD-10-CM | POA: Insufficient documentation

## 2023-07-30 DIAGNOSIS — R0789 Other chest pain: Secondary | ICD-10-CM | POA: Diagnosis not present

## 2023-07-30 DIAGNOSIS — E782 Mixed hyperlipidemia: Secondary | ICD-10-CM | POA: Diagnosis not present

## 2023-07-30 DIAGNOSIS — I200 Unstable angina: Secondary | ICD-10-CM | POA: Insufficient documentation

## 2023-07-30 DIAGNOSIS — R9439 Abnormal result of other cardiovascular function study: Secondary | ICD-10-CM

## 2023-07-30 DIAGNOSIS — I10 Essential (primary) hypertension: Secondary | ICD-10-CM

## 2023-07-30 DIAGNOSIS — R42 Dizziness and giddiness: Secondary | ICD-10-CM

## 2023-07-30 DIAGNOSIS — I5022 Chronic systolic (congestive) heart failure: Secondary | ICD-10-CM

## 2023-07-30 DIAGNOSIS — R0609 Other forms of dyspnea: Secondary | ICD-10-CM | POA: Insufficient documentation

## 2023-07-30 DIAGNOSIS — I110 Hypertensive heart disease with heart failure: Secondary | ICD-10-CM | POA: Insufficient documentation

## 2023-07-30 LAB — CBC WITH DIFFERENTIAL/PLATELET
Basophils Absolute: 0.1 10*3/uL (ref 0.0–0.2)
Basos: 1 %
EOS (ABSOLUTE): 0.2 10*3/uL (ref 0.0–0.4)
Eos: 4 %
Hematocrit: 39.5 % (ref 37.5–51.0)
Hemoglobin: 13.3 g/dL (ref 13.0–17.7)
Immature Grans (Abs): 0 10*3/uL (ref 0.0–0.1)
Immature Granulocytes: 0 %
Lymphocytes Absolute: 1.7 10*3/uL (ref 0.7–3.1)
Lymphs: 26 %
MCH: 31.2 pg (ref 26.6–33.0)
MCHC: 33.7 g/dL (ref 31.5–35.7)
MCV: 93 fL (ref 79–97)
Monocytes Absolute: 0.6 10*3/uL (ref 0.1–0.9)
Monocytes: 10 %
Neutrophils Absolute: 3.8 10*3/uL (ref 1.4–7.0)
Neutrophils: 59 %
Platelets: 149 10*3/uL — ABNORMAL LOW (ref 150–450)
RBC: 4.26 x10E6/uL (ref 4.14–5.80)
RDW: 12.8 % (ref 11.6–15.4)
WBC: 6.5 10*3/uL (ref 3.4–10.8)

## 2023-07-30 NOTE — Progress Notes (Signed)
 Cardiology Office Note   Date:  07/30/2023   ID:  Jorge Gregory, DOB 07/04/1955, MRN [id number]  PCP:  Alan Mulder, MD  Cardiologist:  Adrian Blackwater, MD      History of Present Illness: Jorge Gregory is a 68 y.o. male who presents for  Chief Complaint  Patient presents with   Acute Visit    Jaw Aching, BP Running High, Extreme Fatigue, SOB, Dizzy    Yesterday had nearly passed out, dizzy and chest pains.BP was 153/78  Chest Pain  This is a new problem. The onset quality is sudden. The problem occurs 2 to 4 times per day. The pain is at a severity of 10/10. The quality of the pain is described as tightness. Associated symptoms include diaphoresis, dizziness, near-syncope, syncope and weakness.      Past Medical History:  Diagnosis Date   Diabetes mellitus without complication (HCC)    Hep C w/ coma, chronic      Past Surgical History:  Procedure Laterality Date   CHOLECYSTECTOMY       Current Outpatient Medications  Medication Sig Dispense Refill   aspirin EC 81 MG tablet Take 1 tablet (81 mg total) by mouth daily. Swallow whole. 30 tablet 12   ergocalciferol (VITAMIN D2) 1.25 MG (50000 UT) capsule Take 50,000 Units by mouth once a week.     insulin aspart (NOVOLOG) 100 UNIT/ML injection Inject into the skin 3 (three) times daily before meals.     loratadine (CLARITIN) 10 MG tablet Take 10 mg by mouth daily.     losartan (COZAAR) 50 MG tablet Take 1 tablet (50 mg total) by mouth 2 (two) times daily. 60 tablet 11   metoprolol succinate (TOPROL XL) 25 MG 24 hr tablet Take 1 tablet (25 mg total) by mouth daily. 30 tablet 11   Multiple Vitamin (MULTIVITAMIN) tablet Take 1 tablet by mouth daily.     naproxen (NAPROSYN) 250 MG tablet Take by mouth 2 (two) times daily with a meal.     rosuvastatin (CRESTOR) 40 MG tablet Take 1 tablet (40 mg total) by mouth daily. 30 tablet 2   sitaGLIPtin-metformin (JANUMET) 50-1000 MG tablet Take 1 tablet by mouth 2 (two) times  daily with a meal.     traZODone (DESYREL) 50 MG tablet Take 50 mg by mouth at bedtime.     insulin glargine (LANTUS) 100 UNIT/ML injection Inject into the skin daily. (Patient not taking: Reported on 07/13/2023)     lidocaine (LIDODERM) 5 % Place 1 patch onto the skin daily. Remove & Discard patch within 12 hours or as directed by MD (Patient not taking: Reported on 07/13/2023) 30 patch 0   No current facility-administered medications for this visit.    Allergies:   Fluoxetine hcl and Percocet [oxycodone-acetaminophen]    Social History:   reports that he has never smoked. He has never used smokeless tobacco. He reports that he does not drink alcohol. No history on file for drug use.   Family History:  family history is not on file.    ROS:     Review of Systems  Constitutional:  Positive for diaphoresis.  HENT: Negative.    Eyes: Negative.   Respiratory: Negative.    Cardiovascular:  Positive for chest pain, syncope and near-syncope.  Gastrointestinal: Negative.   Genitourinary: Negative.   Musculoskeletal: Negative.   Skin: Negative.   Neurological:  Positive for dizziness and weakness.  Endo/Heme/Allergies: Negative.   Psychiatric/Behavioral: Negative.  All other systems reviewed and are negative.     All other systems are reviewed and negative.    PHYSICAL EXAM: VS:  BP (!) 140/78   Pulse (!) 56   Ht 5\' 9"  (1.753 m)   Wt 180 lb (81.6 kg)   SpO2 97%   BMI 26.58 kg/m  , BMI Body mass index is 26.58 kg/m. Last weight:  Wt Readings from Last 3 Encounters:  07/30/23 180 lb (81.6 kg)  07/13/23 183 lb (83 kg)  06/05/23 179 lb (81.2 kg)     Physical Exam Vitals reviewed.  Constitutional:      Appearance: Normal appearance. He is normal weight.  HENT:     Head: Normocephalic.     Nose: Nose normal.     Mouth/Throat:     Mouth: Mucous membranes are moist.  Eyes:     Pupils: Pupils are equal, round, and reactive to light.  Cardiovascular:     Rate and  Rhythm: Normal rate and regular rhythm.     Pulses: Normal pulses.     Heart sounds: Normal heart sounds.  Pulmonary:     Effort: Pulmonary effort is normal.  Abdominal:     General: Abdomen is flat. Bowel sounds are normal.  Musculoskeletal:        General: Normal range of motion.     Cervical back: Normal range of motion.  Skin:    General: Skin is warm.  Neurological:     General: No focal deficit present.     Mental Status: He is alert.  Psychiatric:        Mood and Affect: Mood normal.       EKG:   Recent Labs: 03/31/2023: BUN Piccolo, Waived 23; Creatinine Piccolo, Waived 1.1    Lipid Panel    Component Value Date/Time   CHOL 161 02/18/2007 1024   TRIG 86 02/18/2007 1024   HDL 38.0 (L) 02/18/2007 1024   CHOLHDL 4.2 CALC 02/18/2007 1024   VLDL 17 02/18/2007 1024   LDLCALC 106 (H) 02/18/2007 1024      Other studies Reviewed: Additional studies/ records that were reviewed today include:  Review of the above records demonstrates:       No data to display            ASSESSMENT AND PLAN:    ICD-10-CM   1. Other chest pain  R07.89 Comprehensive metabolic panel    CBC with Differential/Platelet    Procedural/ Surgical Case Request: LEFT HEART CATH AND CORONARY ANGIOGRAPHY with possible coronary intervention, Left Heart Cath with possible coronary intervention    2. Unstable angina (HCC)  I20.0 Comprehensive metabolic panel    CBC with Differential/Platelet    Procedural/ Surgical Case Request: LEFT HEART CATH AND CORONARY ANGIOGRAPHY with possible coronary intervention, Left Heart Cath with possible coronary intervention    3. SOB (shortness of breath)  R06.02 Comprehensive metabolic panel    CBC with Differential/Platelet    Procedural/ Surgical Case Request: LEFT HEART CATH AND CORONARY ANGIOGRAPHY with possible coronary intervention, Left Heart Cath with possible coronary intervention    4. Mixed hyperlipidemia  E78.2 Comprehensive metabolic panel     CBC with Differential/Platelet    Procedural/ Surgical Case Request: LEFT HEART CATH AND CORONARY ANGIOGRAPHY with possible coronary intervention, Left Heart Cath with possible coronary intervention    5. Primary hypertension  I10 Comprehensive metabolic panel    CBC with Differential/Platelet    Procedural/ Surgical Case Request: LEFT HEART CATH AND CORONARY ANGIOGRAPHY  with possible coronary intervention, Left Heart Cath with possible coronary intervention    6. Cardiomyopathy due to hypertension, with heart failure (HCC)  I11.0 Comprehensive metabolic panel   W29 CBC with Differential/Platelet    Procedural/ Surgical Case Request: LEFT HEART CATH AND CORONARY ANGIOGRAPHY with possible coronary intervention, Left Heart Cath with possible coronary intervention    7. Coronary artery disease involving native coronary artery of native heart without angina pectoris  I25.10 Comprehensive metabolic panel    CBC with Differential/Platelet    Procedural/ Surgical Case Request: LEFT HEART CATH AND CORONARY ANGIOGRAPHY with possible coronary intervention, Left Heart Cath with possible coronary intervention    8. CHF (congestive heart failure), NYHA class I, chronic, systolic (HCC)  I50.22 Comprehensive metabolic panel    CBC with Differential/Platelet    Procedural/ Surgical Case Request: LEFT HEART CATH AND CORONARY ANGIOGRAPHY with possible coronary intervention, Left Heart Cath with possible coronary intervention    9. Dizziness  R42 Comprehensive metabolic panel    CBC with Differential/Platelet    Procedural/ Surgical Case Request: LEFT HEART CATH AND CORONARY ANGIOGRAPHY with possible coronary intervention, Left Heart Cath with possible coronary intervention       Problem List Items Addressed This Visit       Cardiovascular and Mediastinum   Unstable angina (HCC)   Relevant Orders   Comprehensive metabolic panel   CBC with Differential/Platelet   Procedural/ Surgical Case Request:  LEFT HEART CATH AND CORONARY ANGIOGRAPHY with possible coronary intervention, Left Heart Cath with possible coronary intervention   Primary hypertension   Relevant Orders   Comprehensive metabolic panel   CBC with Differential/Platelet   Procedural/ Surgical Case Request: LEFT HEART CATH AND CORONARY ANGIOGRAPHY with possible coronary intervention, Left Heart Cath with possible coronary intervention   Cardiomyopathy due to hypertension, with heart failure (HCC)   Relevant Orders   Comprehensive metabolic panel   CBC with Differential/Platelet   Procedural/ Surgical Case Request: LEFT HEART CATH AND CORONARY ANGIOGRAPHY with possible coronary intervention, Left Heart Cath with possible coronary intervention   Coronary artery disease involving native coronary artery of native heart without angina pectoris   Relevant Orders   Comprehensive metabolic panel   CBC with Differential/Platelet   Procedural/ Surgical Case Request: LEFT HEART CATH AND CORONARY ANGIOGRAPHY with possible coronary intervention, Left Heart Cath with possible coronary intervention   CHF (congestive heart failure), NYHA class I, chronic, systolic (HCC)   Relevant Orders   Comprehensive metabolic panel   CBC with Differential/Platelet   Procedural/ Surgical Case Request: LEFT HEART CATH AND CORONARY ANGIOGRAPHY with possible coronary intervention, Left Heart Cath with possible coronary intervention     Other   Other chest pain - Primary   Relevant Orders   Comprehensive metabolic panel   CBC with Differential/Platelet   Procedural/ Surgical Case Request: LEFT HEART CATH AND CORONARY ANGIOGRAPHY with possible coronary intervention, Left Heart Cath with possible coronary intervention   SOB (shortness of breath)   Relevant Orders   Comprehensive metabolic panel   CBC with Differential/Platelet   Procedural/ Surgical Case Request: LEFT HEART CATH AND CORONARY ANGIOGRAPHY with possible coronary intervention, Left Heart Cath  with possible coronary intervention   Mixed hyperlipidemia   Relevant Orders   Comprehensive metabolic panel   CBC with Differential/Platelet   Procedural/ Surgical Case Request: LEFT HEART CATH AND CORONARY ANGIOGRAPHY with possible coronary intervention, Left Heart Cath with possible coronary intervention   Dizziness   Relevant Orders   Comprehensive metabolic panel  CBC with Differential/Platelet   Procedural/ Surgical Case Request: LEFT HEART CATH AND CORONARY ANGIOGRAPHY with possible coronary intervention, Left Heart Cath with possible coronary intervention       Disposition:   Return for set up for left heart cath.    Total time spent: 50 minutes  Signed,  Adrian Blackwater, MD  07/30/2023 10:02 AM    [facility]

## 2023-07-31 LAB — COMPREHENSIVE METABOLIC PANEL WITH GFR
ALT: 24 IU/L (ref 0–44)
AST: 20 IU/L (ref 0–40)
Albumin: 4.7 g/dL (ref 3.9–4.9)
Alkaline Phosphatase: 51 IU/L (ref 44–121)
BUN/Creatinine Ratio: 22 (ref 10–24)
BUN: 23 mg/dL (ref 8–27)
Bilirubin Total: 0.4 mg/dL (ref 0.0–1.2)
CO2: 23 mmol/L (ref 20–29)
Calcium: 10 mg/dL (ref 8.6–10.2)
Chloride: 105 mmol/L (ref 96–106)
Creatinine, Ser: 1.05 mg/dL (ref 0.76–1.27)
Globulin, Total: 2.7 g/dL (ref 1.5–4.5)
Glucose: 111 mg/dL — ABNORMAL HIGH (ref 70–99)
Potassium: 5.6 mmol/L — ABNORMAL HIGH (ref 3.5–5.2)
Sodium: 142 mmol/L (ref 134–144)
Total Protein: 7.4 g/dL (ref 6.0–8.5)
eGFR: 78 mL/min/{1.73_m2} (ref >59)

## 2023-08-06 ENCOUNTER — Other Ambulatory Visit: Payer: Self-pay

## 2023-08-06 ENCOUNTER — Ambulatory Visit
Admission: RE | Admit: 2023-08-06 | Discharge: 2023-08-06 | Disposition: A | Discharge status: Home / Self Care | Source: Ambulatory Visit | Attending: Cardiovascular Disease | Admitting: Cardiovascular Disease

## 2023-08-06 ENCOUNTER — Encounter
Admission: RE | Disposition: A | Discharge status: Home / Self Care | Payer: Self-pay | Source: Ambulatory Visit | Attending: Cardiovascular Disease

## 2023-08-06 ENCOUNTER — Encounter: Payer: Self-pay | Admitting: Cardiovascular Disease

## 2023-08-06 DIAGNOSIS — I25110 Atherosclerotic heart disease of native coronary artery with unstable angina pectoris: Secondary | ICD-10-CM | POA: Insufficient documentation

## 2023-08-06 DIAGNOSIS — I5022 Chronic systolic (congestive) heart failure: Secondary | ICD-10-CM | POA: Insufficient documentation

## 2023-08-06 DIAGNOSIS — E782 Mixed hyperlipidemia: Secondary | ICD-10-CM | POA: Diagnosis not present

## 2023-08-06 DIAGNOSIS — I200 Unstable angina: Secondary | ICD-10-CM | POA: Insufficient documentation

## 2023-08-06 DIAGNOSIS — I10 Essential (primary) hypertension: Secondary | ICD-10-CM | POA: Insufficient documentation

## 2023-08-06 DIAGNOSIS — I43 Cardiomyopathy in diseases classified elsewhere: Secondary | ICD-10-CM | POA: Insufficient documentation

## 2023-08-06 DIAGNOSIS — R0789 Other chest pain: Secondary | ICD-10-CM | POA: Diagnosis present

## 2023-08-06 DIAGNOSIS — E119 Type 2 diabetes mellitus without complications: Secondary | ICD-10-CM | POA: Insufficient documentation

## 2023-08-06 DIAGNOSIS — I2510 Atherosclerotic heart disease of native coronary artery without angina pectoris: Secondary | ICD-10-CM | POA: Diagnosis not present

## 2023-08-06 DIAGNOSIS — R0602 Shortness of breath: Secondary | ICD-10-CM | POA: Insufficient documentation

## 2023-08-06 DIAGNOSIS — I119 Hypertensive heart disease without heart failure: Secondary | ICD-10-CM | POA: Insufficient documentation

## 2023-08-06 DIAGNOSIS — Z794 Long term (current) use of insulin: Secondary | ICD-10-CM | POA: Diagnosis not present

## 2023-08-06 DIAGNOSIS — I110 Hypertensive heart disease with heart failure: Secondary | ICD-10-CM | POA: Insufficient documentation

## 2023-08-06 DIAGNOSIS — I25119 Atherosclerotic heart disease of native coronary artery with unspecified angina pectoris: Secondary | ICD-10-CM | POA: Insufficient documentation

## 2023-08-06 DIAGNOSIS — R42 Dizziness and giddiness: Secondary | ICD-10-CM

## 2023-08-06 DIAGNOSIS — E1159 Type 2 diabetes mellitus with other circulatory complications: Secondary | ICD-10-CM | POA: Insufficient documentation

## 2023-08-06 DIAGNOSIS — R9439 Abnormal result of other cardiovascular function study: Secondary | ICD-10-CM

## 2023-08-06 DIAGNOSIS — R0609 Other forms of dyspnea: Secondary | ICD-10-CM | POA: Insufficient documentation

## 2023-08-06 HISTORY — PX: LEFT HEART CATH AND CORONARY ANGIOGRAPHY: CATH118249

## 2023-08-06 LAB — GLUCOSE, CAPILLARY: Glucose-Capillary: 117 mg/dL — ABNORMAL HIGH (ref 70–99)

## 2023-08-06 SURGERY — LEFT HEART CATH AND CORONARY ANGIOGRAPHY
Anesthesia: Moderate Sedation | Laterality: Left

## 2023-08-06 MED ORDER — LABETALOL HCL 5 MG/ML IV SOLN: 10.0000 mg | INTRAVENOUS | Status: DC | PRN

## 2023-08-06 MED ORDER — ACETAMINOPHEN 325 MG PO TABS: 650.0000 mg | ORAL_TABLET | ORAL | Status: DC | PRN

## 2023-08-06 MED ORDER — MIDAZOLAM HCL 2 MG/2ML IJ SOLN
INTRAMUSCULAR | Status: DC | PRN
  Administered 2023-08-06 (×2): 1 mg via INTRAVENOUS

## 2023-08-06 MED ORDER — SODIUM CHLORIDE 0.9 % IV SOLN: 250.0000 mL | INTRAVENOUS | Status: DC | PRN

## 2023-08-06 MED ORDER — FENTANYL CITRATE (PF) 100 MCG/2ML IJ SOLN
INTRAMUSCULAR | Status: DC | PRN
  Administered 2023-08-06 (×2): 25 ug via INTRAVENOUS

## 2023-08-06 MED ORDER — SODIUM CHLORIDE 0.9 % WEIGHT BASED INFUSION
3.0000 mL/kg/h | INTRAVENOUS | Status: DC
  Administered 2023-08-06: 3 mL/kg/h via INTRAVENOUS

## 2023-08-06 MED ORDER — ONDANSETRON HCL 4 MG/2ML IJ SOLN: 4.0000 mg | Freq: Four times a day (QID) | INTRAMUSCULAR | Status: DC | PRN

## 2023-08-06 MED ORDER — LIDOCAINE HCL 1 % IJ SOLN
INTRAMUSCULAR | Status: AC
  Filled 2023-08-06: qty 20

## 2023-08-06 MED ORDER — FENTANYL CITRATE (PF) 100 MCG/2ML IJ SOLN
INTRAMUSCULAR | Status: AC
  Filled 2023-08-06: qty 2

## 2023-08-06 MED ORDER — SODIUM CHLORIDE 0.9% FLUSH: 3.0000 mL | INTRAVENOUS | Status: DC | PRN

## 2023-08-06 MED ORDER — IOHEXOL 300 MG/ML  SOLN
INTRAMUSCULAR | Status: DC | PRN
  Administered 2023-08-06: 100 mL

## 2023-08-06 MED ORDER — HYDRALAZINE HCL 20 MG/ML IJ SOLN: 10.0000 mg | INTRAMUSCULAR | Status: DC | PRN

## 2023-08-06 MED ORDER — HEPARIN (PORCINE) IN NACL 2000-0.9 UNIT/L-% IV SOLN
INTRAVENOUS | Status: DC | PRN
  Administered 2023-08-06: 1000 mL

## 2023-08-06 MED ORDER — SODIUM CHLORIDE 0.9% FLUSH: 3.0000 mL | Freq: Two times a day (BID) | INTRAVENOUS | Status: DC

## 2023-08-06 MED ORDER — HEPARIN (PORCINE) IN NACL 1000-0.9 UT/500ML-% IV SOLN
INTRAVENOUS | Status: AC
  Filled 2023-08-06: qty 1000

## 2023-08-06 MED ORDER — MIDAZOLAM HCL 2 MG/2ML IJ SOLN
INTRAMUSCULAR | Status: AC
  Filled 2023-08-06: qty 2

## 2023-08-06 MED ORDER — SODIUM CHLORIDE 0.9 % WEIGHT BASED INFUSION
1.0000 mL/kg/h | INTRAVENOUS | Status: DC
  Administered 2023-08-06: 1 mL/kg/h via INTRAVENOUS

## 2023-08-06 MED ORDER — LIDOCAINE HCL (PF) 1 % IJ SOLN
INTRAMUSCULAR | Status: DC | PRN
  Administered 2023-08-06: 10 mL

## 2023-08-06 MED ORDER — ASPIRIN 81 MG PO CHEW: 81.0000 mg | CHEWABLE_TABLET | ORAL | Status: DC

## 2023-08-06 MED ORDER — SODIUM CHLORIDE 0.9 % WEIGHT BASED INFUSION: 1.0000 mL/kg/h | INTRAVENOUS | Status: DC

## 2023-08-06 SURGICAL SUPPLY — 11 items
CATH INFINITI 5 FR 3DRC (CATHETERS) IMPLANT
CATH INFINITI 5FR MULTPACK ANG (CATHETERS) IMPLANT
DEVICE CLOSURE MYNXGRIP 5F (Vascular Products) IMPLANT
NDL PERC 18GX7CM (NEEDLE) IMPLANT
NEEDLE PERC 18GX7CM (NEEDLE) ×1 IMPLANT
PACK CARDIAC CATH (CUSTOM PROCEDURE TRAY) ×1 IMPLANT
PROTECTION STATION PRESSURIZED (MISCELLANEOUS) ×1 IMPLANT
SET ATX-X65L (MISCELLANEOUS) IMPLANT
SHEATH AVANTI 5FR X 11CM (SHEATH) IMPLANT
STATION PROTECTION PRESSURIZED (MISCELLANEOUS) IMPLANT
WIRE GUIDERIGHT .035X150 (WIRE) IMPLANT

## 2023-08-10 ENCOUNTER — Encounter: Payer: Self-pay | Admitting: Cardiovascular Disease

## 2023-08-10 ENCOUNTER — Ambulatory Visit: Admitting: Cardiovascular Disease

## 2023-08-10 ENCOUNTER — Ambulatory Visit (INDEPENDENT_AMBULATORY_CARE_PROVIDER_SITE_OTHER): Admitting: Cardiovascular Disease

## 2023-08-10 VITALS — BP 122/72 | HR 68 | Ht 69.0 in | Wt 182.8 lb

## 2023-08-10 DIAGNOSIS — I10 Essential (primary) hypertension: Secondary | ICD-10-CM | POA: Diagnosis not present

## 2023-08-10 DIAGNOSIS — E782 Mixed hyperlipidemia: Secondary | ICD-10-CM | POA: Diagnosis not present

## 2023-08-10 DIAGNOSIS — I110 Hypertensive heart disease with heart failure: Secondary | ICD-10-CM | POA: Diagnosis not present

## 2023-08-10 DIAGNOSIS — I5022 Chronic systolic (congestive) heart failure: Secondary | ICD-10-CM | POA: Diagnosis not present

## 2023-08-10 DIAGNOSIS — I43 Cardiomyopathy in diseases classified elsewhere: Secondary | ICD-10-CM

## 2023-08-10 DIAGNOSIS — I2510 Atherosclerotic heart disease of native coronary artery without angina pectoris: Secondary | ICD-10-CM

## 2023-08-10 MED ORDER — ISOSORBIDE MONONITRATE ER 30 MG PO TB24: 30.0000 mg | ORAL_TABLET | Freq: Every day | ORAL | 11 refills | Status: DC

## 2023-08-10 MED ORDER — NITROGLYCERIN 0.4 MG SL SUBL: 0.4000 mg | SUBLINGUAL_TABLET | SUBLINGUAL | 3 refills | Status: AC | PRN

## 2023-08-10 NOTE — Progress Notes (Signed)
 Cardiology Office Note   Date:  08/10/2023   ID:  Jorge Gregory, DOB July 16, 1955, MRN [id number]  PCP:  Giovanna Lake, MD  Cardiologist:  Debborah Fairly, MD      History of Present Illness: Jorge Gregory is a 68 y.o. male who presents for  Chief Complaint  Patient presents with   Follow-up    Cath follow up    Has no chest pain after cardiac cath. Right groin mild induration, no hematoma.      Past Medical History:  Diagnosis Date   Diabetes mellitus without complication (HCC)    Hep C w/ coma, chronic      Past Surgical History:  Procedure Laterality Date   CHOLECYSTECTOMY     LEFT HEART CATH AND CORONARY ANGIOGRAPHY Left 08/06/2023   Procedure: LEFT HEART CATH AND CORONARY ANGIOGRAPHY with possible coronary intervention;  Surgeon: Cherrie Cornwall, MD;  Location: [facility] INVASIVE CV LAB;  Service: Cardiovascular;  Laterality: Left;     Current Outpatient Medications  Medication Sig Dispense Refill   aspirin  EC 81 MG tablet Take 1 tablet (81 mg total) by mouth daily. Swallow whole. 30 tablet 12   cetirizine (ZYRTEC) 10 MG tablet Take 10 mg by mouth daily.     ergocalciferol (VITAMIN D2) 1.25 MG (50000 UT) capsule Take 50,000 Units by mouth once a week.     insulin glargine, 2 Unit Dial, (TOUJEO MAX SOLOSTAR) 300 UNIT/ML Solostar Pen Inject 25 Units into the skin in the morning and at bedtime.     isosorbide  mononitrate (IMDUR ) 30 MG 24 hr tablet Take 1 tablet (30 mg total) by mouth daily. 30 tablet 11   losartan  (COZAAR ) 50 MG tablet Take 1 tablet (50 mg total) by mouth 2 (two) times daily. 60 tablet 11   metoprolol  succinate (TOPROL  XL) 25 MG 24 hr tablet Take 1 tablet (25 mg total) by mouth daily. 30 tablet 11   naproxen (NAPROSYN) 250 MG tablet Take 250 mg by mouth 2 (two) times daily with a meal.     nitroGLYCERIN  (NITROSTAT ) 0.4 MG SL tablet Place 1 tablet (0.4 mg total) under the tongue every 5 (five) minutes as needed for chest pain. 100 tablet 3   Omega-3  Fatty Acids (FISH OIL) 1000 MG CAPS Take 1,000 mg by mouth daily.     rosuvastatin  (CRESTOR ) 40 MG tablet Take 1 tablet (40 mg total) by mouth daily. 30 tablet 2   sitaGLIPtin-metformin (JANUMET) 50-1000 MG tablet Take 1 tablet by mouth 2 (two) times daily with a meal.     traZODone (DESYREL) 50 MG tablet Take 50 mg by mouth at bedtime as needed for sleep.     Turmeric 500 MG CAPS Take 500 mg by mouth daily.     No current facility-administered medications for this visit.    Allergies:   Fluoxetine hcl, Percocet [oxycodone -acetaminophen ], and Prednisone    Social History:   reports that he has never smoked. He has never used smokeless tobacco. He reports that he does not drink alcohol. No history on file for drug use.   Family History:  family history is not on file.    ROS:     Review of Systems  Constitutional: Negative.   HENT: Negative.    Eyes: Negative.   Respiratory: Negative.    Gastrointestinal: Negative.   Genitourinary: Negative.   Musculoskeletal: Negative.   Skin: Negative.   Neurological: Negative.   Endo/Heme/Allergies: Negative.   Psychiatric/Behavioral: Negative.  All other systems reviewed and are negative.     All other systems are reviewed and negative.    PHYSICAL EXAM: VS:  BP 122/72   Pulse 68   Ht 5\' 9"  (1.753 m)   Wt 182 lb 12.8 oz (82.9 kg)   SpO2 97%   BMI 26.99 kg/m  , BMI Body mass index is 26.99 kg/m. Last weight:  Wt Readings from Last 3 Encounters:  08/10/23 182 lb 12.8 oz (82.9 kg)  08/06/23 179 lb 14.3 oz (81.6 kg)  07/30/23 180 lb (81.6 kg)     Physical Exam Vitals reviewed.  Constitutional:      Appearance: Normal appearance. He is normal weight.  HENT:     Head: Normocephalic.     Nose: Nose normal.     Mouth/Throat:     Mouth: Mucous membranes are moist.  Eyes:     Pupils: Pupils are equal, round, and reactive to light.  Cardiovascular:     Rate and Rhythm: Normal rate and regular rhythm.     Pulses: Normal  pulses.     Heart sounds: Normal heart sounds.  Pulmonary:     Effort: Pulmonary effort is normal.  Abdominal:     General: Abdomen is flat. Bowel sounds are normal.  Musculoskeletal:        General: Normal range of motion.     Cervical back: Normal range of motion.  Skin:    General: Skin is warm.  Neurological:     General: No focal deficit present.     Mental Status: He is alert.  Psychiatric:        Mood and Affect: Mood normal.       EKG:   Recent Labs: 07/30/2023: ALT 24; BUN 23; Creatinine, Ser 1.05; Hemoglobin 13.3; Platelets 149; Potassium 5.6; Sodium 142    Lipid Panel    Component Value Date/Time   CHOL 161 02/18/2007 1024   TRIG 86 02/18/2007 1024   HDL 38.0 (L) 02/18/2007 1024   CHOLHDL 4.2 CALC 02/18/2007 1024   VLDL 17 02/18/2007 1024   LDLCALC 106 (H) 02/18/2007 1024      Other studies Reviewed: Additional studies/ records that were reviewed today include:  Review of the above records demonstrates:       No data to display            ASSESSMENT AND PLAN:    ICD-10-CM   1. Primary hypertension  I10 isosorbide  mononitrate (IMDUR ) 30 MG 24 hr tablet    nitroGLYCERIN  (NITROSTAT ) 0.4 MG SL tablet    2. Mixed hyperlipidemia  E78.2 isosorbide  mononitrate (IMDUR ) 30 MG 24 hr tablet    nitroGLYCERIN  (NITROSTAT ) 0.4 MG SL tablet    3. Cardiomyopathy due to hypertension, with heart failure (HCC)  I11.0 isosorbide  mononitrate (IMDUR ) 30 MG 24 hr tablet   I43 nitroGLYCERIN  (NITROSTAT ) 0.4 MG SL tablet    4. CHF (congestive heart failure), NYHA class I, chronic, systolic (HCC)  I50.22 isosorbide  mononitrate (IMDUR ) 30 MG 24 hr tablet    nitroGLYCERIN  (NITROSTAT ) 0.4 MG SL tablet    5. Coronary artery disease involving native coronary artery of native heart without angina pectoris  I25.10 isosorbide  mononitrate (IMDUR ) 30 MG 24 hr tablet    nitroGLYCERIN  (NITROSTAT ) 0.4 MG SL tablet   cardiac cath showed severe 3 vesssel disease with 45% LV  function, [facility].       Problem List Items Addressed This Visit  Cardiovascular and Mediastinum   Primary hypertension - Primary   Relevant Medications   isosorbide  mononitrate (IMDUR ) 30 MG 24 hr tablet   nitroGLYCERIN  (NITROSTAT ) 0.4 MG SL tablet   Cardiomyopathy due to hypertension, with heart failure (HCC)   Relevant Medications   isosorbide  mononitrate (IMDUR ) 30 MG 24 hr tablet   nitroGLYCERIN  (NITROSTAT ) 0.4 MG SL tablet   Coronary artery disease involving native coronary artery of native heart without angina pectoris   Relevant Medications   isosorbide  mononitrate (IMDUR ) 30 MG 24 hr tablet   nitroGLYCERIN  (NITROSTAT ) 0.4 MG SL tablet   CHF (congestive heart failure), NYHA class I, chronic, systolic (HCC)   Relevant Medications   isosorbide  mononitrate (IMDUR ) 30 MG 24 hr tablet   nitroGLYCERIN  (NITROSTAT ) 0.4 MG SL tablet     Other   Mixed hyperlipidemia   Relevant Medications   isosorbide  mononitrate (IMDUR ) 30 MG 24 hr tablet   nitroGLYCERIN  (NITROSTAT ) 0.4 MG SL tablet       Disposition:   Return in about 4 weeks (around 09/07/2023).    Total time spent: 45 minutes  Signed,  Debborah Fairly, MD  08/10/2023 9:29 AM    [facility]

## 2023-08-14 ENCOUNTER — Other Ambulatory Visit: Payer: Self-pay | Admitting: Cardiovascular Disease

## 2023-08-14 DIAGNOSIS — R0602 Shortness of breath: Secondary | ICD-10-CM

## 2023-08-14 DIAGNOSIS — I110 Hypertensive heart disease with heart failure: Secondary | ICD-10-CM

## 2023-08-14 DIAGNOSIS — I5022 Chronic systolic (congestive) heart failure: Secondary | ICD-10-CM

## 2023-08-14 DIAGNOSIS — I2510 Atherosclerotic heart disease of native coronary artery without angina pectoris: Secondary | ICD-10-CM

## 2023-08-14 DIAGNOSIS — E782 Mixed hyperlipidemia: Secondary | ICD-10-CM

## 2023-08-14 DIAGNOSIS — R0789 Other chest pain: Secondary | ICD-10-CM

## 2023-08-14 DIAGNOSIS — I10 Essential (primary) hypertension: Secondary | ICD-10-CM

## 2023-08-17 ENCOUNTER — Telehealth: Payer: Self-pay | Admitting: Cardiovascular Disease

## 2023-08-17 NOTE — Telephone Encounter (Signed)
 Pt called wanting to let you know that [facility] is going to admit him on 09/02/23, his surgery will be 09/03/23 & he will have to stay a total of 3-4 days, 2 being in the ICU. Pt also stated that [facility] said he would be a good candidate for the robot

## 2023-08-20 HISTORY — PX: CORONARY ARTERY BYPASS GRAFT: SHX141

## 2023-08-27 ENCOUNTER — Other Ambulatory Visit: Payer: Self-pay | Admitting: Cardiology

## 2023-08-27 DIAGNOSIS — Z951 Presence of aortocoronary bypass graft: Secondary | ICD-10-CM

## 2023-09-10 ENCOUNTER — Ambulatory Visit: Admitting: Cardiovascular Disease

## 2023-09-17 ENCOUNTER — Ambulatory Visit: Admitting: Cardiovascular Disease

## 2023-09-22 ENCOUNTER — Telehealth: Payer: Self-pay

## 2023-09-22 NOTE — Telephone Encounter (Signed)
 Patient daughter informed via VM but told her to call back if needed

## 2023-09-22 NOTE — Telephone Encounter (Signed)
 Patient daughter called stating that the patient was having some sx but did not state what they were, LM for pt daughter to call baclk

## 2023-09-24 ENCOUNTER — Ambulatory Visit (INDEPENDENT_AMBULATORY_CARE_PROVIDER_SITE_OTHER): Admitting: Cardiovascular Disease

## 2023-09-24 ENCOUNTER — Encounter: Payer: Self-pay | Admitting: Cardiovascular Disease

## 2023-09-24 VITALS — BP 142/78 | HR 109 | Ht 69.0 in | Wt 164.0 lb

## 2023-09-24 DIAGNOSIS — I10 Essential (primary) hypertension: Secondary | ICD-10-CM

## 2023-09-24 DIAGNOSIS — E782 Mixed hyperlipidemia: Secondary | ICD-10-CM | POA: Diagnosis not present

## 2023-09-24 DIAGNOSIS — I110 Hypertensive heart disease with heart failure: Secondary | ICD-10-CM

## 2023-09-24 DIAGNOSIS — R0602 Shortness of breath: Secondary | ICD-10-CM | POA: Diagnosis not present

## 2023-09-24 DIAGNOSIS — I43 Cardiomyopathy in diseases classified elsewhere: Secondary | ICD-10-CM

## 2023-09-24 DIAGNOSIS — Z951 Presence of aortocoronary bypass graft: Secondary | ICD-10-CM

## 2023-09-24 DIAGNOSIS — I5022 Chronic systolic (congestive) heart failure: Secondary | ICD-10-CM

## 2023-09-24 LAB — CBC WITH DIFFERENTIAL/PLATELET
Basophils Absolute: 0.1 10*3/uL (ref 0.0–0.2)
Basos: 1 %
EOS (ABSOLUTE): 1 10*3/uL — ABNORMAL HIGH (ref 0.0–0.4)
Eos: 8 %
Hematocrit: 40.2 % (ref 37.5–51.0)
Hemoglobin: 13.1 g/dL (ref 13.0–17.7)
Immature Grans (Abs): 0 10*3/uL (ref 0.0–0.1)
Immature Granulocytes: 0 %
Lymphocytes Absolute: 2.4 10*3/uL (ref 0.7–3.1)
Lymphs: 18 %
MCH: 29.5 pg (ref 26.6–33.0)
MCHC: 32.6 g/dL (ref 31.5–35.7)
MCV: 91 fL (ref 79–97)
Monocytes Absolute: 0.9 10*3/uL (ref 0.1–0.9)
Monocytes: 7 %
Neutrophils Absolute: 8.7 10*3/uL — ABNORMAL HIGH (ref 1.4–7.0)
Neutrophils: 66 %
Platelets: 421 10*3/uL (ref 150–450)
RBC: 4.44 x10E6/uL (ref 4.14–5.80)
RDW: 13 % (ref 11.6–15.4)
WBC: 13.2 10*3/uL — ABNORMAL HIGH (ref 3.4–10.8)

## 2023-09-24 MED ORDER — DAPAGLIFLOZIN PROPANEDIOL 10 MG PO TABS: 10.0000 mg | ORAL_TABLET | Freq: Every day | ORAL | 2 refills | Status: DC

## 2023-09-24 NOTE — Progress Notes (Signed)
 Cardiology Office Note   Date:  09/24/2023   ID:  Jorge Gregory, DOB 1955-10-22, MRN [id number]  PCP:  Giovanna Lake, MD  Cardiologist:  Debborah Fairly, MD      History of Present Illness: Jorge Gregory is a 68 y.o. male who presents for  Chief Complaint  Patient presents with   Hospitalization Follow-up    Bypass Follow Up    Had SOB and was placed on lasix and weak. Now stopped it. Lost lots of weight. No chest pain.Has      Past Medical History:  Diagnosis Date   Diabetes mellitus without complication (HCC)    Hep C w/ coma, chronic      Past Surgical History:  Procedure Laterality Date   CHOLECYSTECTOMY     LEFT HEART CATH AND CORONARY ANGIOGRAPHY Left 08/06/2023   Procedure: LEFT HEART CATH AND CORONARY ANGIOGRAPHY with possible coronary intervention;  Surgeon: Cherrie Cornwall, MD;  Location: [facility] INVASIVE CV LAB;  Service: Cardiovascular;  Laterality: Left;     Current Outpatient Medications  Medication Sig Dispense Refill   aspirin  EC 81 MG tablet Take 1 tablet (81 mg total) by mouth daily. Swallow whole. 30 tablet 12   cetirizine (ZYRTEC) 10 MG tablet Take 10 mg by mouth daily.     dapagliflozin propanediol (FARXIGA) 10 MG TABS tablet Take 1 tablet (10 mg total) by mouth daily before breakfast. 30 tablet 2   ergocalciferol (VITAMIN D2) 1.25 MG (50000 UT) capsule Take 50,000 Units by mouth once a week.     insulin glargine, 2 Unit Dial, (TOUJEO MAX SOLOSTAR) 300 UNIT/ML Solostar Pen Inject 25 Units into the skin in the morning and at bedtime.     losartan  (COZAAR ) 50 MG tablet Take 1 tablet (50 mg total) by mouth 2 (two) times daily. 60 tablet 11   metoprolol  succinate (TOPROL  XL) 25 MG 24 hr tablet Take 1 tablet (25 mg total) by mouth daily. 30 tablet 11   nitroGLYCERIN  (NITROSTAT ) 0.4 MG SL tablet Place 1 tablet (0.4 mg total) under the tongue every 5 (five) minutes as needed for chest pain. 100 tablet 3   rosuvastatin  (CRESTOR ) 40 MG tablet TAKE ONE  TABLET BY MOUTH ONE TIME DAILY 30 tablet 2   sitaGLIPtin-metformin (JANUMET) 50-1000 MG tablet Take 1 tablet by mouth 2 (two) times daily with a meal.     naproxen (NAPROSYN) 250 MG tablet Take 250 mg by mouth 2 (two) times daily with a meal.     No current facility-administered medications for this visit.    Allergies:   Fluoxetine hcl, Percocet [oxycodone -acetaminophen ], and Prednisone    Social History:   reports that he has never smoked. He has never used smokeless tobacco. He reports that he does not drink alcohol. No history on file for drug use.   Family History:  family history is not on file.    ROS:     Review of Systems  Constitutional: Negative.   HENT: Negative.    Eyes: Negative.   Respiratory: Negative.    Gastrointestinal: Negative.   Genitourinary: Negative.   Musculoskeletal: Negative.   Skin: Negative.   Neurological: Negative.   Endo/Heme/Allergies: Negative.   Psychiatric/Behavioral: Negative.    All other systems reviewed and are negative.     All other systems are reviewed and negative.    PHYSICAL EXAM: VS:  BP (!) 142/78   Pulse (!) 109   Ht 5\' 9"  (1.753 m)   Wt 164  lb (74.4 kg)   SpO2 97%   BMI 24.22 kg/m  , BMI Body mass index is 24.22 kg/m. Last weight:  Wt Readings from Last 3 Encounters:  09/24/23 164 lb (74.4 kg)  08/10/23 182 lb 12.8 oz (82.9 kg)  08/06/23 179 lb 14.3 oz (81.6 kg)     Physical Exam Vitals reviewed.  Constitutional:      Appearance: Normal appearance. He is normal weight.  HENT:     Head: Normocephalic.     Nose: Nose normal.     Mouth/Throat:     Mouth: Mucous membranes are moist.  Eyes:     Pupils: Pupils are equal, round, and reactive to light.  Cardiovascular:     Rate and Rhythm: Normal rate and regular rhythm.     Pulses: Normal pulses.     Heart sounds: Normal heart sounds.  Pulmonary:     Effort: Pulmonary effort is normal.  Abdominal:     General: Abdomen is flat. Bowel sounds are normal.   Musculoskeletal:        General: Normal range of motion.     Cervical back: Normal range of motion.  Skin:    General: Skin is warm.  Neurological:     General: No focal deficit present.     Mental Status: He is alert.  Psychiatric:        Mood and Affect: Mood normal.       EKG:   Recent Labs: 07/30/2023: ALT 24; BUN 23; Creatinine, Ser 1.05; Hemoglobin 13.3; Platelets 149; Potassium 5.6; Sodium 142    Lipid Panel    Component Value Date/Time   CHOL 161 02/18/2007 1024   TRIG 86 02/18/2007 1024   HDL 38.0 (L) 02/18/2007 1024   CHOLHDL 4.2 CALC 02/18/2007 1024   VLDL 17 02/18/2007 1024   LDLCALC 106 (H) 02/18/2007 1024      Other studies Reviewed: Additional studies/ records that were reviewed today include:  Review of the above records demonstrates:       No data to display            ASSESSMENT AND PLAN:    ICD-10-CM   1. SOB (shortness of breath)  R06.02 dapagliflozin propanediol (FARXIGA) 10 MG TABS tablet    Amb Referral to Cardiac Rehabilitation    Comprehensive metabolic panel    CBC with Differential/Platelet   still SOB    2. S/P CABG (coronary artery bypass graft)  Z95.1 dapagliflozin propanediol (FARXIGA) 10 MG TABS tablet    Amb Referral to Cardiac Rehabilitation    Comprehensive metabolic panel    CBC with Differential/Platelet   4 vessel CABG at [facility],  last month 08/04/23 need rehab    3. Primary hypertension  I10 dapagliflozin propanediol (FARXIGA) 10 MG TABS tablet    Amb Referral to Cardiac Rehabilitation    Comprehensive metabolic panel    CBC with Differential/Platelet    4. Mixed hyperlipidemia  E78.2 dapagliflozin propanediol (FARXIGA) 10 MG TABS tablet    Amb Referral to Cardiac Rehabilitation    Comprehensive metabolic panel    CBC with Differential/Platelet    5. CHF (congestive heart failure), NYHA class I, chronic, systolic (HCC)  I50.22 dapagliflozin propanediol (FARXIGA) 10 MG TABS tablet    Amb Referral to Cardiac  Rehabilitation    Comprehensive metabolic panel    CBC with Differential/Platelet    6. Cardiomyopathy due to hypertension, with heart failure (HCC)  I11.0 dapagliflozin propanediol (FARXIGA) 10 MG TABS tablet   I43 Amb  Referral to Cardiac Rehabilitation    Comprehensive metabolic panel    CBC with Differential/Platelet       Problem List Items Addressed This Visit       Cardiovascular and Mediastinum   Primary hypertension   Relevant Medications   dapagliflozin propanediol (FARXIGA) 10 MG TABS tablet   Other Relevant Orders   Amb Referral to Cardiac Rehabilitation   Comprehensive metabolic panel   CBC with Differential/Platelet   Cardiomyopathy due to hypertension, with heart failure (HCC)   Relevant Medications   dapagliflozin propanediol (FARXIGA) 10 MG TABS tablet   Other Relevant Orders   Amb Referral to Cardiac Rehabilitation   Comprehensive metabolic panel   CBC with Differential/Platelet   CHF (congestive heart failure), NYHA class I, chronic, systolic (HCC)   Relevant Medications   dapagliflozin propanediol (FARXIGA) 10 MG TABS tablet   Other Relevant Orders   Amb Referral to Cardiac Rehabilitation   Comprehensive metabolic panel   CBC with Differential/Platelet     Other   SOB (shortness of breath) - Primary   Relevant Medications   dapagliflozin propanediol (FARXIGA) 10 MG TABS tablet   Other Relevant Orders   Amb Referral to Cardiac Rehabilitation   Comprehensive metabolic panel   CBC with Differential/Platelet   Mixed hyperlipidemia   Relevant Medications   dapagliflozin propanediol (FARXIGA) 10 MG TABS tablet   Other Relevant Orders   Amb Referral to Cardiac Rehabilitation   Comprehensive metabolic panel   CBC with Differential/Platelet   Other Visit Diagnoses       S/P CABG (coronary artery bypass graft)       4 vessel CABG at [facility],  last month 08/04/23 need rehab   Relevant Medications   dapagliflozin propanediol (FARXIGA) 10 MG TABS tablet    Other Relevant Orders   Amb Referral to Cardiac Rehabilitation   Comprehensive metabolic panel   CBC with Differential/Platelet          Disposition:   Return in about 4 weeks (around 10/22/2023) for cardiac rehab and f/u 4 weeks.    Total time spent: 40 minutes  Signed,  Debborah Fairly, MD  09/24/2023 11:14 AM    [facility]

## 2023-09-25 ENCOUNTER — Ambulatory Visit: Admitting: Cardiovascular Disease

## 2023-09-25 LAB — COMPREHENSIVE METABOLIC PANEL WITH GFR
ALT: 34 IU/L (ref 0–44)
AST: 20 IU/L (ref 0–40)
Albumin: 4.4 g/dL (ref 3.9–4.9)
Alkaline Phosphatase: 121 IU/L (ref 44–121)
BUN/Creatinine Ratio: 20 (ref 10–24)
BUN: 19 mg/dL (ref 8–27)
Bilirubin Total: 0.4 mg/dL (ref 0.0–1.2)
CO2: 18 mmol/L — ABNORMAL LOW (ref 20–29)
Calcium: 10 mg/dL (ref 8.6–10.2)
Chloride: 102 mmol/L (ref 96–106)
Creatinine, Ser: 0.96 mg/dL (ref 0.76–1.27)
Globulin, Total: 3.5 g/dL (ref 1.5–4.5)
Glucose: 131 mg/dL — ABNORMAL HIGH (ref 70–99)
Potassium: 4.8 mmol/L (ref 3.5–5.2)
Sodium: 138 mmol/L (ref 134–144)
Total Protein: 7.9 g/dL (ref 6.0–8.5)
eGFR: 87 mL/min/{1.73_m2} (ref >59)

## 2023-10-16 ENCOUNTER — Encounter: Attending: Cardiovascular Disease | Admitting: *Deleted

## 2023-10-16 ENCOUNTER — Ambulatory Visit: Admitting: Cardiovascular Disease

## 2023-10-16 DIAGNOSIS — Z951 Presence of aortocoronary bypass graft: Secondary | ICD-10-CM

## 2023-10-16 NOTE — Progress Notes (Signed)
 Initial phone call completed. Diagnosis can be found in [facility] 5/14. EP Orientation scheduled for Tuesday 7/1 at 8am.

## 2023-10-20 ENCOUNTER — Encounter: Attending: Cardiovascular Disease

## 2023-10-20 VITALS — Ht 69.0 in | Wt 172.4 lb

## 2023-10-20 DIAGNOSIS — Z951 Presence of aortocoronary bypass graft: Secondary | ICD-10-CM | POA: Insufficient documentation

## 2023-10-20 NOTE — Progress Notes (Signed)
 Cardiac Individual Treatment Plan  Patient Details  Name: Jorge Gregory MRN: [id number] Date of Birth: 1955/10/12 Referring Provider:   Flowsheet Row Cardiac Rehab from 10/20/2023 in [facility] Cardiac and Pulmonary Rehab  Referring Provider Dr. Denyse Bathe, MD    Initial Encounter Date:  Flowsheet Row Cardiac Rehab from 10/20/2023 in [facility] Cardiac and Pulmonary Rehab  Date 10/20/23    Visit Diagnosis: S/P CABG x 3  Patient's Home Medications on Admission:  Current Outpatient Medications:    aspirin  EC 81 MG tablet, Take 1 tablet (81 mg total) by mouth daily. Swallow whole., Disp: 30 tablet, Rfl: 12   cetirizine (ZYRTEC) 10 MG tablet, Take 10 mg by mouth daily., Disp: , Rfl:    dapagliflozin  propanediol (FARXIGA ) 10 MG TABS tablet, Take 1 tablet (10 mg total) by mouth daily before breakfast., Disp: 30 tablet, Rfl: 2   ergocalciferol (VITAMIN D2) 1.25 MG (50000 UT) capsule, Take 50,000 Units by mouth once a week., Disp: , Rfl:    insulin glargine (LANTUS) 100 UNIT/ML injection, Inject 20 Units into the skin daily., Disp: , Rfl:    losartan  (COZAAR ) 50 MG tablet, Take 1 tablet (50 mg total) by mouth 2 (two) times daily. (Patient not taking: Reported on 10/16/2023), Disp: 60 tablet, Rfl: 11   metoprolol  succinate (TOPROL  XL) 25 MG 24 hr tablet, Take 1 tablet (25 mg total) by mouth daily. (Patient not taking: Reported on 10/16/2023), Disp: 30 tablet, Rfl: 11   metoprolol  tartrate (LOPRESSOR ) 25 MG tablet, Take 25 mg by mouth., Disp: , Rfl:    naproxen (NAPROSYN) 250 MG tablet, Take 250 mg by mouth 2 (two) times daily with a meal., Disp: , Rfl:    nitroGLYCERIN  (NITROSTAT ) 0.4 MG SL tablet, Place 1 tablet (0.4 mg total) under the tongue every 5 (five) minutes as needed for chest pain., Disp: 100 tablet, Rfl: 3   rosuvastatin  (CRESTOR ) 40 MG tablet, TAKE ONE TABLET BY MOUTH ONE TIME DAILY, Disp: 30 tablet, Rfl: 2   sitaGLIPtin-metformin (JANUMET) 50-1000 MG tablet, Take 1 tablet by mouth 2 (two) times  daily with a meal., Disp: , Rfl:   Past Medical History: Past Medical History:  Diagnosis Date   Diabetes mellitus without complication (HCC)    Hep C w/ coma, chronic     Tobacco Use: Social History   Tobacco Use  Smoking Status Never  Smokeless Tobacco Never    Labs: Review Flowsheet  More data exists      Latest Ref Rng & Units 01/02/2005 04/07/2005 09/29/2005 02/18/2007 04/14/2007  Labs for ITP Cardiac and Pulmonary Rehab  Cholestrol 0 - 200 mg/dL - - - 838  -  LDL (calc) 0 - 99 mg/dL - - - 893  -  HDL-C >60.9 mg/dL - - - 61.9  -  Trlycerides 0 - 149 mg/dL - - - 86  -  Hemoglobin A1c 4.6 - 6.0 % 7.0  5.8  6.6  8.5  8.4      Exercise Target Goals: Exercise Program Goal: Individual exercise prescription set using results from initial 6 min walk test and THRR while considering  patient's activity barriers and safety.   Exercise Prescription Goal: Initial exercise prescription builds to 30-45 minutes a day of aerobic activity, 2-3 days per week.  Home exercise guidelines will be given to patient during program as part of exercise prescription that the participant will acknowledge.   Education: Aerobic Exercise: - Group verbal and visual presentation on the components of exercise prescription. Introduces F.I.T.T  principle from ACSM for exercise prescriptions.  Reviews F.I.T.T. principles of aerobic exercise including progression. Written material given at graduation. Flowsheet Row Cardiac Rehab from 10/20/2023 in [facility] Cardiac and Pulmonary Rehab  Education need identified 10/20/23    Education: Resistance Exercise: - Group verbal and visual presentation on the components of exercise prescription. Introduces F.I.T.T principle from ACSM for exercise prescriptions  Reviews F.I.T.T. principles of resistance exercise including progression. Written material given at graduation.    Education: Exercise & Equipment Safety: - Individual verbal instruction and demonstration of  equipment use and safety with use of the equipment. Flowsheet Row Cardiac Rehab from 10/20/2023 in [facility] Cardiac and Pulmonary Rehab  Date 10/20/23  Educator NT  Instruction Review Code 1- Verbalizes Understanding    Education: Exercise Physiology & General Exercise Guidelines: - Group verbal and written instruction with models to review the exercise physiology of the cardiovascular system and associated critical values. Provides general exercise guidelines with specific guidelines to those with heart or lung disease.    Education: Flexibility, Balance, Mind/Body Relaxation: - Group verbal and visual presentation with interactive activity on the components of exercise prescription. Introduces F.I.T.T principle from ACSM for exercise prescriptions. Reviews F.I.T.T. principles of flexibility and balance exercise training including progression. Also discusses the mind body connection.  Reviews various relaxation techniques to help reduce and manage stress (i.e. Deep breathing, progressive muscle relaxation, and visualization). Balance handout provided to take home. Written material given at graduation.   Activity Barriers & Risk Stratification:  Activity Barriers & Cardiac Risk Stratification - 10/20/23 0941       Activity Barriers & Cardiac Risk Stratification   Activity Barriers Joint Problems;Chest Pain/Angina;Neck/Spine Problems;Other (comment)    Comments L hip pain, Hx neck surgeries    Cardiac Risk Stratification High          6 Minute Walk:  6 Minute Walk     Row Name 10/20/23 0940         6 Minute Walk   Phase Initial     Distance 1490 feet     Walk Time 6 minutes     # of Rest Breaks 0     MPH 2.82     METS 3.51     RPE 6     Perceived Dyspnea  0     VO2 Peak 12.29     Symptoms No     Resting HR 69 bpm     Resting BP 122/60     Resting Oxygen Saturation  98 %     Exercise Oxygen Saturation  during 6 min walk 99 %     Max Ex. HR 91 bpm     Max Ex. BP 130/64      2 Minute Post BP 120/62        Oxygen Initial Assessment:   Oxygen Re-Evaluation:   Oxygen Discharge (Final Oxygen Re-Evaluation):   Initial Exercise Prescription:  Initial Exercise Prescription - 10/20/23 0900       Date of Initial Exercise RX and Referring Provider   Date 10/20/23    Referring Provider Dr. Denyse Bathe, MD      Oxygen   Maintain Oxygen Saturation 88% or higher      Treadmill   MPH 2.5    Grade 1    Minutes 15    METs 3.26      NuStep   Level 3    SPM 80    Minutes 15    METs 3.51  REL-XR   Level 3    Speed 50    Minutes 15    METs 3.51      Prescription Details   Frequency (times per week) 3    Duration Progress to 30 minutes of continuous aerobic without signs/symptoms of physical distress      Intensity   THRR 40-80% of Max Heartrate 103-137    Ratings of Perceived Exertion 11-13    Perceived Dyspnea 0-4      Progression   Progression Continue to progress workloads to maintain intensity without signs/symptoms of physical distress.      Resistance Training   Training Prescription Yes    Weight 5 lb    Reps 10-15          Perform Capillary Blood Glucose checks as needed.  Exercise Prescription Changes:   Exercise Prescription Changes     Row Name 10/20/23 0900             Response to Exercise   Blood Pressure (Admit) 122/60       Blood Pressure (Exercise) 130/64       Blood Pressure (Exit) 120/62       Heart Rate (Admit) 69 bpm       Heart Rate (Exercise) 91 bpm       Heart Rate (Exit) 81 bpm       Oxygen Saturation (Admit) 98 %       Oxygen Saturation (Exercise) 99 %       Rating of Perceived Exertion (Exercise) 6       Perceived Dyspnea (Exercise) 0       Symptoms none       Comments Results          Exercise Comments:   Exercise Goals and Review:   Exercise Goals     Row Name 10/20/23 0942             Exercise Goals   Increase Physical Activity Yes       Intervention Provide  advice, education, support and counseling about physical activity/exercise needs.;Develop an individualized exercise prescription for aerobic and resistive training based on initial evaluation findings, risk stratification, comorbidities and participant's personal goals.       Expected Outcomes Short Term: Attend rehab on a regular basis to increase amount of physical activity.;Long Term: Exercising regularly at least 3-5 days a week.;Long Term: Add in home exercise to make exercise part of routine and to increase amount of physical activity.       Increase Strength and Stamina Yes       Intervention Provide advice, education, support and counseling about physical activity/exercise needs.;Develop an individualized exercise prescription for aerobic and resistive training based on initial evaluation findings, risk stratification, comorbidities and participant's personal goals.       Expected Outcomes Short Term: Increase workloads from initial exercise prescription for resistance, speed, and METs.;Long Term: Improve cardiorespiratory fitness, muscular endurance and strength as measured by increased METs and functional capacity ( );Short Term: Perform resistance training exercises routinely during rehab and add in resistance training at home       Able to understand and use rate of perceived exertion (RPE) scale Yes       Intervention Provide education and explanation on how to use RPE scale       Expected Outcomes Short Term: Able to use RPE daily in rehab to express subjective intensity level;Long Term:  Able to use RPE to guide intensity level when exercising independently  Able to understand and use Dyspnea scale Yes       Intervention Provide education and explanation on how to use Dyspnea scale       Expected Outcomes Short Term: Able to use Dyspnea scale daily in rehab to express subjective sense of shortness of breath during exertion;Long Term: Able to use Dyspnea scale to guide intensity  level when exercising independently       Knowledge and understanding of Target Heart Rate Range (THRR) Yes       Intervention Provide education and explanation of THRR including how the numbers were predicted and where they are located for reference       Expected Outcomes Short Term: Able to state/look up THRR;Long Term: Able to use THRR to govern intensity when exercising independently;Short Term: Able to use daily as guideline for intensity in rehab       Able to check pulse independently Yes       Intervention Review the importance of being able to check your own pulse for safety during independent exercise;Provide education and demonstration on how to check pulse in carotid and radial arteries.       Expected Outcomes Short Term: Able to explain why pulse checking is important during independent exercise;Long Term: Able to check pulse independently and accurately       Understanding of Exercise Prescription Yes       Intervention Provide education, explanation, and written materials on patient's individual exercise prescription       Expected Outcomes Short Term: Able to explain program exercise prescription;Long Term: Able to explain home exercise prescription to exercise independently          Exercise Goals Re-Evaluation :   Discharge Exercise Prescription (Final Exercise Prescription Changes):  Exercise Prescription Changes - 10/20/23 0900       Response to Exercise   Blood Pressure (Admit) 122/60    Blood Pressure (Exercise) 130/64    Blood Pressure (Exit) 120/62    Heart Rate (Admit) 69 bpm    Heart Rate (Exercise) 91 bpm    Heart Rate (Exit) 81 bpm    Oxygen Saturation (Admit) 98 %    Oxygen Saturation (Exercise) 99 %    Rating of Perceived Exertion (Exercise) 6    Perceived Dyspnea (Exercise) 0    Symptoms none    Comments Results          Nutrition:  Target Goals: Understanding of nutrition guidelines, daily intake of sodium 1500mg , cholesterol 200mg ,  calories 30% from fat and 7% or less from saturated fats, daily to have 5 or more servings of fruits and vegetables.  Education: All About Nutrition: -Group instruction provided by verbal, written material, interactive activities, discussions, models, and posters to present general guidelines for heart healthy nutrition including fat, fiber, MyPlate, the role of sodium in heart healthy nutrition, utilization of the nutrition label, and utilization of this knowledge for meal planning. Follow up email sent as well. Written material given at graduation. Flowsheet Row Cardiac Rehab from 10/20/2023 in [facility] Cardiac and Pulmonary Rehab  Education need identified 10/20/23    Biometrics:  Pre Biometrics - 10/20/23 0943       Pre Biometrics   Height 5' 9 (1.753 m)    Weight 172 lb 6.4 oz (78.2 kg)    Waist Circumference 36 inches    Hip Circumference 35 inches    Waist to Hip Ratio 1.03 %    BMI (Calculated) 25.45    Single Leg Stand 9.34  seconds           Nutrition Therapy Plan and Nutrition Goals:  Nutrition Therapy & Goals - 10/20/23 0929       Intervention Plan   Intervention Prescribe, educate and counsel regarding individualized specific dietary modifications aiming towards targeted core components such as weight, hypertension, lipid management, diabetes, heart failure and other comorbidities.    Expected Outcomes Short Term Goal: Understand basic principles of dietary content, such as calories, fat, sodium, cholesterol and nutrients.;Short Term Goal: A plan has been developed with personal nutrition goals set during dietitian appointment.;Long Term Goal: Adherence to prescribed nutrition plan.          Nutrition Assessments:  MEDIFICTS Score Key: >=70 Need to make dietary changes  40-70 Heart Healthy Diet <= 40 Therapeutic Level Cholesterol Diet  Flowsheet Row Cardiac Rehab from 10/20/2023 in [facility] Cardiac and Pulmonary Rehab  Picture Your Plate Total Score on Admission 61    Picture Your Plate Scores: <59 Unhealthy dietary pattern with much room for improvement. 41-50 Dietary pattern unlikely to meet recommendations for good health and room for improvement. 51-60 More healthful dietary pattern, with some room for improvement.  >60 Healthy dietary pattern, although there may be some specific behaviors that could be improved.    Nutrition Goals Re-Evaluation:   Nutrition Goals Discharge (Final Nutrition Goals Re-Evaluation):   Psychosocial: Target Goals: Acknowledge presence or absence of significant depression and/or stress, maximize coping skills, provide positive support system. Participant is able to verbalize types and ability to use techniques and skills needed for reducing stress and depression.   Education: Stress, Anxiety, and Depression - Group verbal and visual presentation to define topics covered.  Reviews how body is impacted by stress, anxiety, and depression.  Also discusses healthy ways to reduce stress and to treat/manage anxiety and depression.  Written material given at graduation. Flowsheet Row Cardiac Rehab from 10/20/2023 in [facility] Cardiac and Pulmonary Rehab  Education need identified 10/20/23    Education: Sleep Hygiene -Provides group verbal and written instruction about how sleep can affect your health.  Define sleep hygiene, discuss sleep cycles and impact of sleep habits. Review good sleep hygiene tips.    Initial Review & Psychosocial Screening:  Initial Psych Review & Screening - 10/16/23 1010       Initial Review   Current issues with Current Stress Concerns    Source of Stress Concerns Financial      Family Dynamics   Good Support System? Yes      Barriers   Psychosocial barriers to participate in program The patient should benefit from training in stress management and relaxation.;There are no identifiable barriers or psychosocial needs.      Screening Interventions   Interventions Encouraged to exercise;To provide  support and resources with identified psychosocial needs    Expected Outcomes Short Term goal: Utilizing psychosocial counselor, staff and physician to assist with identification of specific Stressors or current issues interfering with healing process. Setting desired goal for each stressor or current issue identified.;Long Term Goal: Stressors or current issues are controlled or eliminated.;Short Term goal: Identification and review with participant of any Quality of Life or Depression concerns found by scoring the questionnaire.;Long Term goal: The participant improves quality of Life and PHQ9 Scores as seen by post scores and/or verbalization of changes          Quality of Life Scores:   Quality of Life - 10/20/23 0923       Quality of Life  Select Quality of Life      Quality of Life Scores   Health/Function Pre 30 %    Socioeconomic Pre 30 %    Psych/Spiritual Pre 30 %    Family Pre 30 %    GLOBAL Pre 30 %         Scores of 19 and below usually indicate a poorer quality of life in these areas.  A difference of  2-3 points is a clinically meaningful difference.  A difference of 2-3 points in the total score of the Quality of Life Index has been associated with significant improvement in overall quality of life, self-image, physical symptoms, and general health in studies assessing change in quality of life.  PHQ-9: Review Flowsheet       10/20/2023  Depression screen PHQ 2/9  Decreased Interest 0  Down, Depressed, Hopeless 0  PHQ - 2 Score 0  Altered sleeping 2  Tired, decreased energy 0  Change in appetite 0  Feeling bad or failure about yourself  0  Trouble concentrating 0  Moving slowly or fidgety/restless 0  Suicidal thoughts 0  PHQ-9 Score 2  Difficult doing work/chores Not difficult at all   Interpretation of Total Score  Total Score Depression Severity:  1-4 = Minimal depression, 5-9 = Mild depression, 10-14 = Moderate depression, 15-19 = Moderately severe  depression, 20-27 = Severe depression   Psychosocial Evaluation and Intervention:  Psychosocial Evaluation - 10/16/23 1031       Psychosocial Evaluation & Interventions   Interventions Relaxation education    Comments Mr. Maceachern is coming to cardiac rehab after CABG x 3. He states his biggest stressor is financial due to him not returning to work full time yet. 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 He is already back to mowing the yard, lifting weights, and other variety of household chores. He mentions he can tell a big difference in his body since surgery, he has a lot more energy and doesn't get as angry as quick. He finds himself not speed walking everywhere and staying calm in the car while driving. He has been monitoring his blood sugar and blood pressure at home and both are within his normal range. He wants to attend the program to see how his heart is doing as he works on returning to his normal active lifestyle    Expected Outcomes Short: attend cardiac rehab for education and exercise Long: develop and miantain positive self care habits    Continue Psychosocial Services  Follow up required by staff          Psychosocial Re-Evaluation:   Psychosocial Discharge (Final Psychosocial Re-Evaluation):   Vocational Rehabilitation: Provide vocational rehab assistance to qualifying candidates.   Vocational Rehab Evaluation & Intervention:  Vocational Rehab - 10/16/23 1038       Initial Vocational Rehab Evaluation & Intervention   Assessment shows need for Vocational Rehabilitation No          Education: Education Goals: Education classes will be provided on a variety of topics geared toward better understanding of heart health and risk factor modification. Participant will state understanding/return demonstration of topics presented as noted by education test scores.  Learning  Barriers/Preferences:  Learning Barriers/Preferences - 10/16/23 1008       Learning Barriers/Preferences   Learning Barriers None    Learning Preferences None  General Cardiac Education Topics:  AED/CPR: - Group verbal and written instruction with the use of models to demonstrate the basic use of the AED with the basic ABC's of resuscitation.   Anatomy and Cardiac Procedures: - Group verbal and visual presentation and models provide information about basic cardiac anatomy and function. Reviews the testing methods done to diagnose heart disease and the outcomes of the test results. Describes the treatment choices: Medical Management, Angioplasty, or Coronary Bypass Surgery for treating various heart conditions including Myocardial Infarction, Angina, Valve Disease, and Cardiac Arrhythmias.  Written material given at graduation. Flowsheet Row Cardiac Rehab from 10/20/2023 in [facility] Cardiac and Pulmonary Rehab  Education need identified 10/20/23    Medication Safety: - Group verbal and visual instruction to review commonly prescribed medications for heart and lung disease. Reviews the medication, class of the drug, and side effects. Includes the steps to properly store meds and maintain the prescription regimen.  Written material given at graduation.   Intimacy: - Group verbal instruction through game format to discuss how heart and lung disease can affect sexual intimacy. Written material given at graduation..   Know Your Numbers and Heart Failure: - Group verbal and visual instruction to discuss disease risk factors for cardiac and pulmonary disease and treatment options.  Reviews associated critical values for Overweight/Obesity, Hypertension, Cholesterol, and Diabetes.  Discusses basics of heart failure: signs/symptoms and treatments.  Introduces Heart Failure Zone chart for action plan for heart failure.  Written material given at graduation. Flowsheet Row Cardiac Rehab from  10/20/2023 in [facility] Cardiac and Pulmonary Rehab  Education need identified 10/20/23    Infection Prevention: - Provides verbal and written material to individual with discussion of infection control including proper hand washing and proper equipment cleaning during exercise session. Flowsheet Row Cardiac Rehab from 10/20/2023 in [facility] Cardiac and Pulmonary Rehab  Date 10/20/23  Educator NT  Instruction Review Code 1- Verbalizes Understanding    Falls Prevention: - Provides verbal and written material to individual with discussion of falls prevention and safety. Flowsheet Row Cardiac Rehab from 10/20/2023 in [facility] Cardiac and Pulmonary Rehab  Date 10/20/23  Educator NT  Instruction Review Code 1- Verbalizes Understanding    Other: -Provides group and verbal instruction on various topics (see comments)   Knowledge Questionnaire Score:  Knowledge Questionnaire Score - 10/20/23 0925       Knowledge Questionnaire Score   Pre Score 15/26          Core Components/Risk Factors/Patient Goals at Admission:  Personal Goals and Risk Factors at Admission - 10/16/23 1038       Core Components/Risk Factors/Patient Goals on Admission   Diabetes Yes    Intervention Provide education about signs/symptoms and action to take for hypo/hyperglycemia.;Provide education about proper nutrition, including hydration, and aerobic/resistive exercise prescription along with prescribed medications to achieve blood glucose in normal ranges: Fasting glucose 65-99 mg/dL    Expected Outcomes Short Term: Participant verbalizes understanding of the signs/symptoms and immediate care of hyper/hypoglycemia, proper foot care and importance of medication, aerobic/resistive exercise and nutrition plan for blood glucose control.;Long Term: Attainment of HbA1C < 7%.    Hypertension Yes    Intervention Provide education on lifestyle modifcations including regular physical activity/exercise, weight management, moderate sodium  restriction and increased consumption of fresh fruit, vegetables, and low fat dairy, alcohol moderation, and smoking cessation.;Monitor prescription use compliance.    Expected Outcomes Short Term: Continued assessment and intervention until BP is < 140/66mm HG in hypertensive participants. < 130/58mm  HG in hypertensive participants with diabetes, heart failure or chronic kidney disease.;Long Term: Maintenance of blood pressure at goal levels.    Lipids Yes    Intervention Provide education and support for participant on nutrition & aerobic/resistive exercise along with prescribed medications to achieve LDL 70mg , HDL >40mg .    Expected Outcomes Short Term: Participant states understanding of desired cholesterol values and is compliant with medications prescribed. Participant is following exercise prescription and nutrition guidelines.;Long Term: Cholesterol controlled with medications as prescribed, with individualized exercise RX and with personalized nutrition plan. Value goals: LDL < 70mg , HDL > 40 mg.          Education:Diabetes - Individual verbal and written instruction to review signs/symptoms of diabetes, desired ranges of glucose level fasting, after meals and with exercise. Acknowledge that pre and post exercise glucose checks will be done for 3 sessions at entry of program. Flowsheet Row Cardiac Rehab from 10/20/2023 in [facility]  Date 10/20/23  Educator NT  Instruction Review Code 1- Verbalizes Understanding    Core Components/Risk Factors/Patient Goals Review:    Core Components/Risk Factors/Patient Goals at Discharge (Final Review):    ITP Comments:  ITP Comments     Row Name 10/16/23 1030 10/20/23 0921         ITP Comments Initial phone call completed. Diagnosis can be found in [facility] 5/14. EP Orientation scheduled for Tuesday 7/1 at 8am. Completed and gym orientation for cardiac rehab. Initial ITP created and sent for review to Dr. Oneil Pinal,  Medical Director.         Comments: Initial ITP

## 2023-10-20 NOTE — Patient Instructions (Signed)
 Patient Instructions  Patient Details  Name: Jorge Gregory MRN: [id number] Date of Birth: 19-Apr-1956 Referring Provider:  Fernand Denyse DELENA, MD  Below are your personal goals for exercise, nutrition, and risk factors. Our goal is to help you stay on track towards obtaining and maintaining these goals. We will be discussing your progress on these goals with you throughout the program.  Initial Exercise Prescription:  Initial Exercise Prescription - 10/20/23 0900       Date of Initial Exercise RX and Referring Provider   Date 10/20/23    Referring Provider Dr. Denyse Fernand, MD      Oxygen   Maintain Oxygen Saturation 88% or higher      Treadmill   MPH 2.5    Grade 1    Minutes 15    METs 3.26      NuStep   Level 3    SPM 80    Minutes 15    METs 3.51      REL-XR   Level 3    Speed 50    Minutes 15    METs 3.51      Prescription Details   Frequency (times per week) 3    Duration Progress to 30 minutes of continuous aerobic without signs/symptoms of physical distress      Intensity   THRR 40-80% of Max Heartrate 103-137    Ratings of Perceived Exertion 11-13    Perceived Dyspnea 0-4      Progression   Progression Continue to progress workloads to maintain intensity without signs/symptoms of physical distress.      Resistance Training   Training Prescription Yes    Weight 5 lb    Reps 10-15          Exercise Goals: Frequency: Be able to perform aerobic exercise two to three times per week in program working toward 2-5 days per week of home exercise.  Intensity: Work with a perceived exertion of 11 (fairly light) - 15 (hard) while following your exercise prescription.  We will make changes to your prescription with you as you progress through the program.   Duration: Be able to do 30 to 45 minutes of continuous aerobic exercise in addition to a 5 minute warm-up and a 5 minute cool-down routine.   Nutrition Goals: Your personal nutrition goals will be  established when you do your nutrition analysis with the dietician.  The following are general nutrition guidelines to follow: Cholesterol < 200mg /day Sodium < 1500mg /day Fiber: Men over 50 yrs - 30 grams per day  Personal Goals:  Personal Goals and Risk Factors at Admission - 10/16/23 1038       Core Components/Risk Factors/Patient Goals on Admission   Diabetes Yes    Intervention Provide education about signs/symptoms and action to take for hypo/hyperglycemia.;Provide education about proper nutrition, including hydration, and aerobic/resistive exercise prescription along with prescribed medications to achieve blood glucose in normal ranges: Fasting glucose 65-99 mg/dL    Expected Outcomes Short Term: Participant verbalizes understanding of the signs/symptoms and immediate care of hyper/hypoglycemia, proper foot care and importance of medication, aerobic/resistive exercise and nutrition plan for blood glucose control.;Long Term: Attainment of HbA1C < 7%.    Hypertension Yes    Intervention Provide education on lifestyle modifcations including regular physical activity/exercise, weight management, moderate sodium restriction and increased consumption of fresh fruit, vegetables, and low fat dairy, alcohol moderation, and smoking cessation.;Monitor prescription use compliance.    Expected Outcomes Short Term: Continued assessment and intervention until  BP is < 140/65mm HG in hypertensive participants. < 130/76mm HG in hypertensive participants with diabetes, heart failure or chronic kidney disease.;Long Term: Maintenance of blood pressure at goal levels.    Lipids Yes    Intervention Provide education and support for participant on nutrition & aerobic/resistive exercise along with prescribed medications to achieve LDL 70mg , HDL >40mg .    Expected Outcomes Short Term: Participant states understanding of desired cholesterol values and is compliant with medications prescribed. Participant is  following exercise prescription and nutrition guidelines.;Long Term: Cholesterol controlled with medications as prescribed, with individualized exercise RX and with personalized nutrition plan. Value goals: LDL < 70mg , HDL > 40 mg.          Exercise Goals and Review:  Exercise Goals     Row Name 10/20/23 0942             Exercise Goals   Increase Physical Activity Yes       Intervention Provide advice, education, support and counseling about physical activity/exercise needs.;Develop an individualized exercise prescription for aerobic and resistive training based on initial evaluation findings, risk stratification, comorbidities and participant's personal goals.       Expected Outcomes Short Term: Attend rehab on a regular basis to increase amount of physical activity.;Long Term: Exercising regularly at least 3-5 days a week.;Long Term: Add in home exercise to make exercise part of routine and to increase amount of physical activity.       Increase Strength and Stamina Yes       Intervention Provide advice, education, support and counseling about physical activity/exercise needs.;Develop an individualized exercise prescription for aerobic and resistive training based on initial evaluation findings, risk stratification, comorbidities and participant's personal goals.       Expected Outcomes Short Term: Increase workloads from initial exercise prescription for resistance, speed, and METs.;Long Term: Improve cardiorespiratory fitness, muscular endurance and strength as measured by increased METs and functional capacity ( );Short Term: Perform resistance training exercises routinely during rehab and add in resistance training at home       Able to understand and use rate of perceived exertion (RPE) scale Yes       Intervention Provide education and explanation on how to use RPE scale       Expected Outcomes Short Term: Able to use RPE daily in rehab to express subjective intensity level;Long  Term:  Able to use RPE to guide intensity level when exercising independently       Able to understand and use Dyspnea scale Yes       Intervention Provide education and explanation on how to use Dyspnea scale       Expected Outcomes Short Term: Able to use Dyspnea scale daily in rehab to express subjective sense of shortness of breath during exertion;Long Term: Able to use Dyspnea scale to guide intensity level when exercising independently       Knowledge and understanding of Target Heart Rate Range (THRR) Yes       Intervention Provide education and explanation of THRR including how the numbers were predicted and where they are located for reference       Expected Outcomes Short Term: Able to state/look up THRR;Long Term: Able to use THRR to govern intensity when exercising independently;Short Term: Able to use daily as guideline for intensity in rehab       Able to check pulse independently Yes       Intervention Review the importance of being able to check your own pulse for  safety during independent exercise;Provide education and demonstration on how to check pulse in carotid and radial arteries.       Expected Outcomes Short Term: Able to explain why pulse checking is important during independent exercise;Long Term: Able to check pulse independently and accurately       Understanding of Exercise Prescription Yes       Intervention Provide education, explanation, and written materials on patient's individual exercise prescription       Expected Outcomes Short Term: Able to explain program exercise prescription;Long Term: Able to explain home exercise prescription to exercise independently

## 2023-10-21 ENCOUNTER — Encounter: Payer: Self-pay | Admitting: *Deleted

## 2023-10-21 DIAGNOSIS — Z951 Presence of aortocoronary bypass graft: Secondary | ICD-10-CM

## 2023-10-21 NOTE — Progress Notes (Signed)
 Cardiac Individual Treatment Plan  Patient Details  Name: Jorge Gregory MRN: [id number] Date of Birth: 1955/08/30 Referring Provider:   Flowsheet Row Cardiac Rehab from 10/20/2023 in [facility] Cardiac and Pulmonary Rehab  Referring Provider Dr. Denyse Bathe, MD    Initial Encounter Date:  Flowsheet Row Cardiac Rehab from 10/20/2023 in [facility] Cardiac and Pulmonary Rehab  Date 10/20/23    Visit Diagnosis: S/P CABG x 3  Patient's Home Medications on Admission:  Current Outpatient Medications:    aspirin  EC 81 MG tablet, Take 1 tablet (81 mg total) by mouth daily. Swallow whole., Disp: 30 tablet, Rfl: 12   cetirizine (ZYRTEC) 10 MG tablet, Take 10 mg by mouth daily., Disp: , Rfl:    dapagliflozin  propanediol (FARXIGA ) 10 MG TABS tablet, Take 1 tablet (10 mg total) by mouth daily before breakfast., Disp: 30 tablet, Rfl: 2   ergocalciferol (VITAMIN D2) 1.25 MG (50000 UT) capsule, Take 50,000 Units by mouth once a week., Disp: , Rfl:    insulin glargine (LANTUS) 100 UNIT/ML injection, Inject 20 Units into the skin daily., Disp: , Rfl:    losartan  (COZAAR ) 50 MG tablet, Take 1 tablet (50 mg total) by mouth 2 (two) times daily. (Patient not taking: Reported on 10/16/2023), Disp: 60 tablet, Rfl: 11   metoprolol  succinate (TOPROL  XL) 25 MG 24 hr tablet, Take 1 tablet (25 mg total) by mouth daily. (Patient not taking: Reported on 10/16/2023), Disp: 30 tablet, Rfl: 11   metoprolol  tartrate (LOPRESSOR ) 25 MG tablet, Take 25 mg by mouth., Disp: , Rfl:    naproxen (NAPROSYN) 250 MG tablet, Take 250 mg by mouth 2 (two) times daily with a meal., Disp: , Rfl:    nitroGLYCERIN  (NITROSTAT ) 0.4 MG SL tablet, Place 1 tablet (0.4 mg total) under the tongue every 5 (five) minutes as needed for chest pain., Disp: 100 tablet, Rfl: 3   rosuvastatin  (CRESTOR ) 40 MG tablet, TAKE ONE TABLET BY MOUTH ONE TIME DAILY, Disp: 30 tablet, Rfl: 2   sitaGLIPtin-metformin (JANUMET) 50-1000 MG tablet, Take 1 tablet by mouth 2 (two) times  daily with a meal., Disp: , Rfl:   Past Medical History: Past Medical History:  Diagnosis Date   Diabetes mellitus without complication (HCC)    Hep C w/ coma, chronic     Tobacco Use: Social History   Tobacco Use  Smoking Status Never  Smokeless Tobacco Never    Labs: Review Flowsheet  More data exists      Latest Ref Rng & Units 01/02/2005 04/07/2005 09/29/2005 02/18/2007 04/14/2007  Labs for ITP Cardiac and Pulmonary Rehab  Cholestrol 0 - 200 mg/dL - - - 838  -  LDL (calc) 0 - 99 mg/dL - - - 893  -  HDL-C >60.9 mg/dL - - - 61.9  -  Trlycerides 0 - 149 mg/dL - - - 86  -  Hemoglobin A1c 4.6 - 6.0 % 7.0  5.8  6.6  8.5  8.4      Exercise Target Goals: Exercise Program Goal: Individual exercise prescription set using results from initial 6 min walk test and THRR while considering  patient's activity barriers and safety.   Exercise Prescription Goal: Initial exercise prescription builds to 30-45 minutes a day of aerobic activity, 2-3 days per week.  Home exercise guidelines will be given to patient during program as part of exercise prescription that the participant will acknowledge.   Education: Aerobic Exercise: - Group verbal and visual presentation on the components of exercise prescription. Introduces F.I.T.T  principle from ACSM for exercise prescriptions.  Reviews F.I.T.T. principles of aerobic exercise including progression. Written material given at graduation. Flowsheet Row Cardiac Rehab from 10/20/2023 in [facility] Cardiac and Pulmonary Rehab  Education need identified 10/20/23    Education: Resistance Exercise: - Group verbal and visual presentation on the components of exercise prescription. Introduces F.I.T.T principle from ACSM for exercise prescriptions  Reviews F.I.T.T. principles of resistance exercise including progression. Written material given at graduation.    Education: Exercise & Equipment Safety: - Individual verbal instruction and demonstration of  equipment use and safety with use of the equipment. Flowsheet Row Cardiac Rehab from 10/20/2023 in [facility] Cardiac and Pulmonary Rehab  Date 10/20/23  Educator NT  Instruction Review Code 1- Verbalizes Understanding    Education: Exercise Physiology & General Exercise Guidelines: - Group verbal and written instruction with models to review the exercise physiology of the cardiovascular system and associated critical values. Provides general exercise guidelines with specific guidelines to those with heart or lung disease.    Education: Flexibility, Balance, Mind/Body Relaxation: - Group verbal and visual presentation with interactive activity on the components of exercise prescription. Introduces F.I.T.T principle from ACSM for exercise prescriptions. Reviews F.I.T.T. principles of flexibility and balance exercise training including progression. Also discusses the mind body connection.  Reviews various relaxation techniques to help reduce and manage stress (i.e. Deep breathing, progressive muscle relaxation, and visualization). Balance handout provided to take home. Written material given at graduation.   Activity Barriers & Risk Stratification:  Activity Barriers & Cardiac Risk Stratification - 10/20/23 0941       Activity Barriers & Cardiac Risk Stratification   Activity Barriers Joint Problems;Chest Pain/Angina;Neck/Spine Problems;Other (comment)    Comments L hip pain, Hx neck surgeries    Cardiac Risk Stratification High          6 Minute Walk:  6 Minute Walk     Row Name 10/20/23 0940         6 Minute Walk   Phase Initial     Distance 1490 feet     Walk Time 6 minutes     # of Rest Breaks 0     MPH 2.82     METS 3.51     RPE 6     Perceived Dyspnea  0     VO2 Peak 12.29     Symptoms No     Resting HR 69 bpm     Resting BP 122/60     Resting Oxygen Saturation  98 %     Exercise Oxygen Saturation  during 6 min walk 99 %     Max Ex. HR 91 bpm     Max Ex. BP 130/64      2 Minute Post BP 120/62        Oxygen Initial Assessment:   Oxygen Re-Evaluation:   Oxygen Discharge (Final Oxygen Re-Evaluation):   Initial Exercise Prescription:  Initial Exercise Prescription - 10/20/23 0900       Date of Initial Exercise RX and Referring Provider   Date 10/20/23    Referring Provider Dr. Denyse Bathe, MD      Oxygen   Maintain Oxygen Saturation 88% or higher      Treadmill   MPH 2.5    Grade 1    Minutes 15    METs 3.26      NuStep   Level 3    SPM 80    Minutes 15    METs 3.51  REL-XR   Level 3    Speed 50    Minutes 15    METs 3.51      Prescription Details   Frequency (times per week) 3    Duration Progress to 30 minutes of continuous aerobic without signs/symptoms of physical distress      Intensity   THRR 40-80% of Max Heartrate 103-137    Ratings of Perceived Exertion 11-13    Perceived Dyspnea 0-4      Progression   Progression Continue to progress workloads to maintain intensity without signs/symptoms of physical distress.      Resistance Training   Training Prescription Yes    Weight 5 lb    Reps 10-15          Perform Capillary Blood Glucose checks as needed.  Exercise Prescription Changes:   Exercise Prescription Changes     Row Name 10/20/23 0900             Response to Exercise   Blood Pressure (Admit) 122/60       Blood Pressure (Exercise) 130/64       Blood Pressure (Exit) 120/62       Heart Rate (Admit) 69 bpm       Heart Rate (Exercise) 91 bpm       Heart Rate (Exit) 81 bpm       Oxygen Saturation (Admit) 98 %       Oxygen Saturation (Exercise) 99 %       Rating of Perceived Exertion (Exercise) 6       Perceived Dyspnea (Exercise) 0       Symptoms none       Comments Results          Exercise Comments:   Exercise Goals and Review:   Exercise Goals     Row Name 10/20/23 0942             Exercise Goals   Increase Physical Activity Yes       Intervention Provide  advice, education, support and counseling about physical activity/exercise needs.;Develop an individualized exercise prescription for aerobic and resistive training based on initial evaluation findings, risk stratification, comorbidities and participant's personal goals.       Expected Outcomes Short Term: Attend rehab on a regular basis to increase amount of physical activity.;Long Term: Exercising regularly at least 3-5 days a week.;Long Term: Add in home exercise to make exercise part of routine and to increase amount of physical activity.       Increase Strength and Stamina Yes       Intervention Provide advice, education, support and counseling about physical activity/exercise needs.;Develop an individualized exercise prescription for aerobic and resistive training based on initial evaluation findings, risk stratification, comorbidities and participant's personal goals.       Expected Outcomes Short Term: Increase workloads from initial exercise prescription for resistance, speed, and METs.;Long Term: Improve cardiorespiratory fitness, muscular endurance and strength as measured by increased METs and functional capacity ( );Short Term: Perform resistance training exercises routinely during rehab and add in resistance training at home       Able to understand and use rate of perceived exertion (RPE) scale Yes       Intervention Provide education and explanation on how to use RPE scale       Expected Outcomes Short Term: Able to use RPE daily in rehab to express subjective intensity level;Long Term:  Able to use RPE to guide intensity level when exercising independently  Able to understand and use Dyspnea scale Yes       Intervention Provide education and explanation on how to use Dyspnea scale       Expected Outcomes Short Term: Able to use Dyspnea scale daily in rehab to express subjective sense of shortness of breath during exertion;Long Term: Able to use Dyspnea scale to guide intensity  level when exercising independently       Knowledge and understanding of Target Heart Rate Range (THRR) Yes       Intervention Provide education and explanation of THRR including how the numbers were predicted and where they are located for reference       Expected Outcomes Short Term: Able to state/look up THRR;Long Term: Able to use THRR to govern intensity when exercising independently;Short Term: Able to use daily as guideline for intensity in rehab       Able to check pulse independently Yes       Intervention Review the importance of being able to check your own pulse for safety during independent exercise;Provide education and demonstration on how to check pulse in carotid and radial arteries.       Expected Outcomes Short Term: Able to explain why pulse checking is important during independent exercise;Long Term: Able to check pulse independently and accurately       Understanding of Exercise Prescription Yes       Intervention Provide education, explanation, and written materials on patient's individual exercise prescription       Expected Outcomes Short Term: Able to explain program exercise prescription;Long Term: Able to explain home exercise prescription to exercise independently          Exercise Goals Re-Evaluation :   Discharge Exercise Prescription (Final Exercise Prescription Changes):  Exercise Prescription Changes - 10/20/23 0900       Response to Exercise   Blood Pressure (Admit) 122/60    Blood Pressure (Exercise) 130/64    Blood Pressure (Exit) 120/62    Heart Rate (Admit) 69 bpm    Heart Rate (Exercise) 91 bpm    Heart Rate (Exit) 81 bpm    Oxygen Saturation (Admit) 98 %    Oxygen Saturation (Exercise) 99 %    Rating of Perceived Exertion (Exercise) 6    Perceived Dyspnea (Exercise) 0    Symptoms none    Comments Results          Nutrition:  Target Goals: Understanding of nutrition guidelines, daily intake of sodium 1500mg , cholesterol 200mg ,  calories 30% from fat and 7% or less from saturated fats, daily to have 5 or more servings of fruits and vegetables.  Education: All About Nutrition: -Group instruction provided by verbal, written material, interactive activities, discussions, models, and posters to present general guidelines for heart healthy nutrition including fat, fiber, MyPlate, the role of sodium in heart healthy nutrition, utilization of the nutrition label, and utilization of this knowledge for meal planning. Follow up email sent as well. Written material given at graduation. Flowsheet Row Cardiac Rehab from 10/20/2023 in [facility] Cardiac and Pulmonary Rehab  Education need identified 10/20/23    Biometrics:  Pre Biometrics - 10/20/23 0943       Pre Biometrics   Height 5' 9 (1.753 m)    Weight 172 lb 6.4 oz (78.2 kg)    Waist Circumference 36 inches    Hip Circumference 35 inches    Waist to Hip Ratio 1.03 %    BMI (Calculated) 25.45    Single Leg Stand 9.34  seconds           Nutrition Therapy Plan and Nutrition Goals:  Nutrition Therapy & Goals - 10/20/23 0929       Intervention Plan   Intervention Prescribe, educate and counsel regarding individualized specific dietary modifications aiming towards targeted core components such as weight, hypertension, lipid management, diabetes, heart failure and other comorbidities.    Expected Outcomes Short Term Goal: Understand basic principles of dietary content, such as calories, fat, sodium, cholesterol and nutrients.;Short Term Goal: A plan has been developed with personal nutrition goals set during dietitian appointment.;Long Term Goal: Adherence to prescribed nutrition plan.          Nutrition Assessments:  MEDIFICTS Score Key: >=70 Need to make dietary changes  40-70 Heart Healthy Diet <= 40 Therapeutic Level Cholesterol Diet  Flowsheet Row Cardiac Rehab from 10/20/2023 in [facility] Cardiac and Pulmonary Rehab  Picture Your Plate Total Score on Admission 61    Picture Your Plate Scores: <59 Unhealthy dietary pattern with much room for improvement. 41-50 Dietary pattern unlikely to meet recommendations for good health and room for improvement. 51-60 More healthful dietary pattern, with some room for improvement.  >60 Healthy dietary pattern, although there may be some specific behaviors that could be improved.    Nutrition Goals Re-Evaluation:   Nutrition Goals Discharge (Final Nutrition Goals Re-Evaluation):   Psychosocial: Target Goals: Acknowledge presence or absence of significant depression and/or stress, maximize coping skills, provide positive support system. Participant is able to verbalize types and ability to use techniques and skills needed for reducing stress and depression.   Education: Stress, Anxiety, and Depression - Group verbal and visual presentation to define topics covered.  Reviews how body is impacted by stress, anxiety, and depression.  Also discusses healthy ways to reduce stress and to treat/manage anxiety and depression.  Written material given at graduation. Flowsheet Row Cardiac Rehab from 10/20/2023 in [facility] Cardiac and Pulmonary Rehab  Education need identified 10/20/23    Education: Sleep Hygiene -Provides group verbal and written instruction about how sleep can affect your health.  Define sleep hygiene, discuss sleep cycles and impact of sleep habits. Review good sleep hygiene tips.    Initial Review & Psychosocial Screening:  Initial Psych Review & Screening - 10/16/23 1010       Initial Review   Current issues with Current Stress Concerns    Source of Stress Concerns Financial      Family Dynamics   Good Support System? Yes      Barriers   Psychosocial barriers to participate in program The patient should benefit from training in stress management and relaxation.;There are no identifiable barriers or psychosocial needs.      Screening Interventions   Interventions Encouraged to exercise;To provide  support and resources with identified psychosocial needs    Expected Outcomes Short Term goal: Utilizing psychosocial counselor, staff and physician to assist with identification of specific Stressors or current issues interfering with healing process. Setting desired goal for each stressor or current issue identified.;Long Term Goal: Stressors or current issues are controlled or eliminated.;Short Term goal: Identification and review with participant of any Quality of Life or Depression concerns found by scoring the questionnaire.;Long Term goal: The participant improves quality of Life and PHQ9 Scores as seen by post scores and/or verbalization of changes          Quality of Life Scores:   Quality of Life - 10/20/23 0923       Quality of Life  Select Quality of Life      Quality of Life Scores   Health/Function Pre 30 %    Socioeconomic Pre 30 %    Psych/Spiritual Pre 30 %    Family Pre 30 %    GLOBAL Pre 30 %         Scores of 19 and below usually indicate a poorer quality of life in these areas.  A difference of  2-3 points is a clinically meaningful difference.  A difference of 2-3 points in the total score of the Quality of Life Index has been associated with significant improvement in overall quality of life, self-image, physical symptoms, and general health in studies assessing change in quality of life.  PHQ-9: Review Flowsheet       10/20/2023  Depression screen PHQ 2/9  Decreased Interest 0  Down, Depressed, Hopeless 0  PHQ - 2 Score 0  Altered sleeping 2  Tired, decreased energy 0  Change in appetite 0  Feeling bad or failure about yourself  0  Trouble concentrating 0  Moving slowly or fidgety/restless 0  Suicidal thoughts 0  PHQ-9 Score 2  Difficult doing work/chores Not difficult at all   Interpretation of Total Score  Total Score Depression Severity:  1-4 = Minimal depression, 5-9 = Mild depression, 10-14 = Moderate depression, 15-19 = Moderately severe  depression, 20-27 = Severe depression   Psychosocial Evaluation and Intervention:  Psychosocial Evaluation - 10/16/23 1031       Psychosocial Evaluation & Interventions   Interventions Relaxation education    Comments Mr. Fulghum is coming to cardiac rehab after CABG x 3. He states his biggest stressor is financial due to him not returning to work full time yet. 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 He is already back to mowing the yard, lifting weights, and other variety of household chores. He mentions he can tell a big difference in his body since surgery, he has a lot more energy and doesn't get as angry as quick. He finds himself not speed walking everywhere and staying calm in the car while driving. He has been monitoring his blood sugar and blood pressure at home and both are within his normal range. He wants to attend the program to see how his heart is doing as he works on returning to his normal active lifestyle    Expected Outcomes Short: attend cardiac rehab for education and exercise Long: develop and miantain positive self care habits    Continue Psychosocial Services  Follow up required by staff          Psychosocial Re-Evaluation:   Psychosocial Discharge (Final Psychosocial Re-Evaluation):   Vocational Rehabilitation: Provide vocational rehab assistance to qualifying candidates.   Vocational Rehab Evaluation & Intervention:  Vocational Rehab - 10/16/23 1038       Initial Vocational Rehab Evaluation & Intervention   Assessment shows need for Vocational Rehabilitation No          Education: Education Goals: Education classes will be provided on a variety of topics geared toward better understanding of heart health and risk factor modification. Participant will state understanding/return demonstration of topics presented as noted by education test scores.  Learning  Barriers/Preferences:  Learning Barriers/Preferences - 10/16/23 1008       Learning Barriers/Preferences   Learning Barriers None    Learning Preferences None  General Cardiac Education Topics:  AED/CPR: - Group verbal and written instruction with the use of models to demonstrate the basic use of the AED with the basic ABC's of resuscitation.   Anatomy and Cardiac Procedures: - Group verbal and visual presentation and models provide information about basic cardiac anatomy and function. Reviews the testing methods done to diagnose heart disease and the outcomes of the test results. Describes the treatment choices: Medical Management, Angioplasty, or Coronary Bypass Surgery for treating various heart conditions including Myocardial Infarction, Angina, Valve Disease, and Cardiac Arrhythmias.  Written material given at graduation. Flowsheet Row Cardiac Rehab from 10/20/2023 in [facility] Cardiac and Pulmonary Rehab  Education need identified 10/20/23    Medication Safety: - Group verbal and visual instruction to review commonly prescribed medications for heart and lung disease. Reviews the medication, class of the drug, and side effects. Includes the steps to properly store meds and maintain the prescription regimen.  Written material given at graduation.   Intimacy: - Group verbal instruction through game format to discuss how heart and lung disease can affect sexual intimacy. Written material given at graduation..   Know Your Numbers and Heart Failure: - Group verbal and visual instruction to discuss disease risk factors for cardiac and pulmonary disease and treatment options.  Reviews associated critical values for Overweight/Obesity, Hypertension, Cholesterol, and Diabetes.  Discusses basics of heart failure: signs/symptoms and treatments.  Introduces Heart Failure Zone chart for action plan for heart failure.  Written material given at graduation. Flowsheet Row Cardiac Rehab from  10/20/2023 in [facility] Cardiac and Pulmonary Rehab  Education need identified 10/20/23    Infection Prevention: - Provides verbal and written material to individual with discussion of infection control including proper hand washing and proper equipment cleaning during exercise session. Flowsheet Row Cardiac Rehab from 10/20/2023 in [facility] Cardiac and Pulmonary Rehab  Date 10/20/23  Educator NT  Instruction Review Code 1- Verbalizes Understanding    Falls Prevention: - Provides verbal and written material to individual with discussion of falls prevention and safety. Flowsheet Row Cardiac Rehab from 10/20/2023 in [facility] Cardiac and Pulmonary Rehab  Date 10/20/23  Educator NT  Instruction Review Code 1- Verbalizes Understanding    Other: -Provides group and verbal instruction on various topics (see comments)   Knowledge Questionnaire Score:  Knowledge Questionnaire Score - 10/20/23 0925       Knowledge Questionnaire Score   Pre Score 15/26          Core Components/Risk Factors/Patient Goals at Admission:  Personal Goals and Risk Factors at Admission - 10/16/23 1038       Core Components/Risk Factors/Patient Goals on Admission   Diabetes Yes    Intervention Provide education about signs/symptoms and action to take for hypo/hyperglycemia.;Provide education about proper nutrition, including hydration, and aerobic/resistive exercise prescription along with prescribed medications to achieve blood glucose in normal ranges: Fasting glucose 65-99 mg/dL    Expected Outcomes Short Term: Participant verbalizes understanding of the signs/symptoms and immediate care of hyper/hypoglycemia, proper foot care and importance of medication, aerobic/resistive exercise and nutrition plan for blood glucose control.;Long Term: Attainment of HbA1C < 7%.    Hypertension Yes    Intervention Provide education on lifestyle modifcations including regular physical activity/exercise, weight management, moderate sodium  restriction and increased consumption of fresh fruit, vegetables, and low fat dairy, alcohol moderation, and smoking cessation.;Monitor prescription use compliance.    Expected Outcomes Short Term: Continued assessment and intervention until BP is < 140/47mm HG in hypertensive participants. < 130/59mm  HG in hypertensive participants with diabetes, heart failure or chronic kidney disease.;Long Term: Maintenance of blood pressure at goal levels.    Lipids Yes    Intervention Provide education and support for participant on nutrition & aerobic/resistive exercise along with prescribed medications to achieve LDL 70mg , HDL >40mg .    Expected Outcomes Short Term: Participant states understanding of desired cholesterol values and is compliant with medications prescribed. Participant is following exercise prescription and nutrition guidelines.;Long Term: Cholesterol controlled with medications as prescribed, with individualized exercise RX and with personalized nutrition plan. Value goals: LDL < 70mg , HDL > 40 mg.          Education:Diabetes - Individual verbal and written instruction to review signs/symptoms of diabetes, desired ranges of glucose level fasting, after meals and with exercise. Acknowledge that pre and post exercise glucose checks will be done for 3 sessions at entry of program. Flowsheet Row Cardiac Rehab from 10/20/2023 in [facility] Cardiac and Pulmonary Rehab  Date 10/20/23  Educator NT  Instruction Review Code 1- Verbalizes Understanding    Core Components/Risk Factors/Patient Goals Review:    Core Components/Risk Factors/Patient Goals at Discharge (Final Review):    ITP Comments:  ITP Comments     Row Name 10/16/23 1030 10/20/23 0921 10/21/23 1056       ITP Comments Initial phone call completed. Diagnosis can be found in [facility] 5/14. EP Orientation scheduled for Tuesday 7/1 at 8am. Completed and gym orientation for cardiac rehab. Initial ITP created and sent for review to Dr.  Oneil Pinal, Medical Director. 30 Day review completed. Medical Director ITP review done, changes made as directed, and signed approval by Medical Director.        Comments: 30 day review

## 2023-10-26 ENCOUNTER — Ambulatory Visit (INDEPENDENT_AMBULATORY_CARE_PROVIDER_SITE_OTHER): Admitting: Cardiovascular Disease

## 2023-10-26 ENCOUNTER — Encounter: Payer: Self-pay | Admitting: Cardiovascular Disease

## 2023-10-26 ENCOUNTER — Ambulatory Visit: Admitting: Cardiovascular Disease

## 2023-10-26 ENCOUNTER — Encounter

## 2023-10-26 VITALS — BP 118/60 | HR 77 | Ht 69.0 in | Wt 175.0 lb

## 2023-10-26 DIAGNOSIS — I5022 Chronic systolic (congestive) heart failure: Secondary | ICD-10-CM

## 2023-10-26 DIAGNOSIS — I200 Unstable angina: Secondary | ICD-10-CM

## 2023-10-26 DIAGNOSIS — I110 Hypertensive heart disease with heart failure: Secondary | ICD-10-CM | POA: Diagnosis not present

## 2023-10-26 DIAGNOSIS — I2510 Atherosclerotic heart disease of native coronary artery without angina pectoris: Secondary | ICD-10-CM

## 2023-10-26 DIAGNOSIS — I10 Essential (primary) hypertension: Secondary | ICD-10-CM

## 2023-10-26 DIAGNOSIS — I43 Cardiomyopathy in diseases classified elsewhere: Secondary | ICD-10-CM

## 2023-10-26 DIAGNOSIS — I25110 Atherosclerotic heart disease of native coronary artery with unstable angina pectoris: Secondary | ICD-10-CM | POA: Diagnosis not present

## 2023-10-26 DIAGNOSIS — R0602 Shortness of breath: Secondary | ICD-10-CM

## 2023-10-26 DIAGNOSIS — Z0130 Encounter for examination of blood pressure without abnormal findings: Secondary | ICD-10-CM

## 2023-10-26 DIAGNOSIS — R42 Dizziness and giddiness: Secondary | ICD-10-CM

## 2023-10-26 DIAGNOSIS — Z951 Presence of aortocoronary bypass graft: Secondary | ICD-10-CM

## 2023-10-26 DIAGNOSIS — E782 Mixed hyperlipidemia: Secondary | ICD-10-CM

## 2023-10-26 DIAGNOSIS — R0789 Other chest pain: Secondary | ICD-10-CM

## 2023-10-26 DIAGNOSIS — I2584 Coronary atherosclerosis due to calcified coronary lesion: Secondary | ICD-10-CM

## 2023-10-26 LAB — GLUCOSE, CAPILLARY
Glucose-Capillary: 140 mg/dL — ABNORMAL HIGH (ref 70–99)
Glucose-Capillary: 106 mg/dL — ABNORMAL HIGH (ref 70–99)

## 2023-10-26 MED ORDER — METOPROLOL SUCCINATE ER 25 MG PO TB24: 25.0000 mg | ORAL_TABLET | Freq: Every day | ORAL | 3 refills | Status: DC

## 2023-10-26 MED ORDER — LOSARTAN POTASSIUM 50 MG PO TABS: 50.0000 mg | ORAL_TABLET | Freq: Two times a day (BID) | ORAL | 11 refills | Status: DC

## 2023-10-26 NOTE — Progress Notes (Signed)
 Daily Session Note  Patient Details  Name: Jorge Gregory MRN: [id number] Date of Birth: 11-18-1955 Referring Provider:   Flowsheet Row Cardiac Rehab from 10/20/2023 in [facility] Cardiac and Pulmonary Rehab  Referring Provider Dr. Denyse Bathe, MD    Encounter Date: 10/26/2023  Check In:  Session Check In - 10/26/23 0724       Check-In   Supervising physician immediately available to respond to emergencies See telemetry face sheet for immediately available ER MD    Location [facility]-Cardiac & Pulmonary Rehab    Staff Present Selinda Pereyra RDN,LDN;Onyinyechi Huante Dyane BS, ACSM CEP, Exercise Physiologist;Jasmene Goswami RN,BSN,MPA;Joseph Rolinda RCP,RRT,BSRT    Virtual Visit No    Medication changes reported     No    Fall or balance concerns reported    No    Warm-up and Cool-down Performed on first and last piece of equipment    Resistance Training Performed Yes    VAD Patient? No    PAD/SET Patient? No      Pain Assessment   Currently in Pain? No/denies             Social History   Tobacco Use  Smoking Status Never  Smokeless Tobacco Never    Goals Met:  Independence with exercise equipment Exercise tolerated well No report of concerns or symptoms today Strength training completed today  Goals Unmet:  Not Applicable  Comments: First full day of exercise!  Patient was oriented to gym and equipment including functions, settings, policies, and procedures.  Patient's individual exercise prescription and treatment plan were reviewed.  All starting workloads were established based on the results of the 6 minute walk test done at initial orientation visit.  The plan for exercise progression was also introduced and progression will be customized based on patient's performance and goals.     Dr. Oneil Pinal is Medical Director for [facility] Cardiac Rehabilitation.  Dr. Fuad Aleskerov is Medical Director for [facility] Pulmonary Rehabilitation.

## 2023-10-26 NOTE — Progress Notes (Signed)
 Cardiology Office Note   Date:  10/26/2023   ID:  Jorge Gregory, DOB [birth year], MRN [id number]  PCP:  Christi Vannie PARAS, MD  Cardiologist:  Denyse Bathe, MD      History of Present Illness: Jorge Gregory is a 68 y.o. male who presents for  Chief Complaint  Patient presents with   Follow-up    4 weeks follow up    Doing well, after CABG, doing rehab.      Past Medical History:  Diagnosis Date   Diabetes mellitus without complication (HCC)    Hep C w/ coma, chronic      Past Surgical History:  Procedure Laterality Date   CHOLECYSTECTOMY     LEFT HEART CATH AND CORONARY ANGIOGRAPHY Left 08/06/2023   Procedure: LEFT HEART CATH AND CORONARY ANGIOGRAPHY with possible coronary intervention;  Surgeon: Bathe Denyse DELENA, MD;  Location: [facility] INVASIVE CV LAB;  Service: Cardiovascular;  Laterality: Left;     Current Outpatient Medications  Medication Sig Dispense Refill   aspirin  EC 81 MG tablet Take 1 tablet (81 mg total) by mouth daily. Swallow whole. 30 tablet 12   cetirizine (ZYRTEC) 10 MG tablet Take 10 mg by mouth daily.     dapagliflozin  propanediol (FARXIGA ) 10 MG TABS tablet Take 1 tablet (10 mg total) by mouth daily before breakfast. 30 tablet 2   ergocalciferol (VITAMIN D2) 1.25 MG (50000 UT) capsule Take 50,000 Units by mouth once a week.     insulin glargine (LANTUS) 100 UNIT/ML injection Inject 20 Units into the skin daily.     metoprolol  tartrate (LOPRESSOR ) 25 MG tablet Take 25 mg by mouth.     nitroGLYCERIN  (NITROSTAT ) 0.4 MG SL tablet Place 1 tablet (0.4 mg total) under the tongue every 5 (five) minutes as needed for chest pain. 100 tablet 3   rosuvastatin  (CRESTOR ) 40 MG tablet TAKE ONE TABLET BY MOUTH ONE TIME DAILY 30 tablet 2   sitaGLIPtin-metformin (JANUMET) 50-1000 MG tablet Take 1 tablet by mouth 2 (two) times daily with a meal.     losartan  (COZAAR ) 50 MG tablet Take 1 tablet (50 mg total) by mouth 2 (two) times daily. 60 tablet 11   metoprolol   succinate (TOPROL  XL) 25 MG 24 hr tablet Take 1 tablet (25 mg total) by mouth daily. 30 tablet 3   naproxen (NAPROSYN) 250 MG tablet Take 250 mg by mouth 2 (two) times daily with a meal.     No current facility-administered medications for this visit.    Allergies:   Fluoxetine hcl, Percocet [oxycodone -acetaminophen ], and Prednisone    Social History:   reports that he has never smoked. He has never used smokeless tobacco. He reports that he does not drink alcohol. No history on file for drug use.   Family History:  family history is not on file.    ROS:     Review of Systems  Constitutional: Negative.   HENT: Negative.    Eyes: Negative.   Respiratory: Negative.    Gastrointestinal: Negative.   Genitourinary: Negative.   Musculoskeletal: Negative.   Skin: Negative.   Neurological: Negative.   Endo/Heme/Allergies: Negative.   Psychiatric/Behavioral: Negative.    All other systems reviewed and are negative.     All other systems are reviewed and negative.    PHYSICAL EXAM: VS:  BP 118/60   Pulse 77   Ht 5' 9 (1.753 m)   Wt 175 lb (79.4 kg)   SpO2 98%   BMI 25.84  kg/m  , BMI Body mass index is 25.84 kg/m. Last weight:  Wt Readings from Last 3 Encounters:  10/26/23 175 lb (79.4 kg)  10/20/23 172 lb 6.4 oz (78.2 kg)  09/24/23 164 lb (74.4 kg)     Physical Exam Vitals reviewed.  Constitutional:      Appearance: Normal appearance. He is normal weight.  HENT:     Head: Normocephalic.     Nose: Nose normal.     Mouth/Throat:     Mouth: Mucous membranes are moist.  Eyes:     Pupils: Pupils are equal, round, and reactive to light.  Cardiovascular:     Rate and Rhythm: Normal rate and regular rhythm.     Pulses: Normal pulses.     Heart sounds: Normal heart sounds.  Pulmonary:     Effort: Pulmonary effort is normal.  Abdominal:     General: Abdomen is flat. Bowel sounds are normal.  Musculoskeletal:        General: Normal range of motion.     Cervical  back: Normal range of motion.  Skin:    General: Skin is warm.  Neurological:     General: No focal deficit present.     Mental Status: He is alert.  Psychiatric:        Mood and Affect: Mood normal.       EKG:   Recent Labs: 09/24/2023: ALT 34; BUN 19; Creatinine, Ser 0.96; Hemoglobin 13.1; Platelets 421; Potassium 4.8; Sodium 138    Lipid Panel    Component Value Date/Time   CHOL 161 02/18/2007 1024   TRIG 86 02/18/2007 1024   HDL 38.0 (L) 02/18/2007 1024   CHOLHDL 4.2 CALC 02/18/2007 1024   VLDL 17 02/18/2007 1024   LDLCALC 106 (H) 02/18/2007 1024      Other studies Reviewed: Additional studies/ records that were reviewed today include:  Review of the above records demonstrates:       No data to display            ASSESSMENT AND PLAN:    ICD-10-CM   1. Cardiomyopathy due to hypertension, with heart failure (HCC)  I11.0 losartan  (COZAAR ) 50 MG tablet   I43 metoprolol  succinate (TOPROL  XL) 25 MG 24 hr tablet    2. CHF (congestive heart failure), NYHA class I, chronic, systolic (HCC)  I50.22 losartan  (COZAAR ) 50 MG tablet    metoprolol  succinate (TOPROL  XL) 25 MG 24 hr tablet    3. Coronary artery disease involving native coronary artery of native heart without angina pectoris  I25.10 losartan  (COZAAR ) 50 MG tablet    4. Primary hypertension  I10 losartan  (COZAAR ) 50 MG tablet    metoprolol  succinate (TOPROL  XL) 25 MG 24 hr tablet    5. Unstable angina (HCC)  I20.0 losartan  (COZAAR ) 50 MG tablet    6. Mixed hyperlipidemia  E78.2 losartan  (COZAAR ) 50 MG tablet    metoprolol  succinate (TOPROL  XL) 25 MG 24 hr tablet    7. SOB (shortness of breath)  R06.02 losartan  (COZAAR ) 50 MG tablet    metoprolol  succinate (TOPROL  XL) 25 MG 24 hr tablet    8. Primary hypertension  I10 losartan  (COZAAR ) 50 MG tablet    metoprolol  succinate (TOPROL  XL) 25 MG 24 hr tablet   Stopped entresto , so being put on losartan  50    9. Shortness of breath  R06.02 losartan  (COZAAR )  50 MG tablet    metoprolol  succinate (TOPROL  XL) 25 MG 24 hr tablet    10.  Other chest pain  R07.89 losartan  (COZAAR ) 50 MG tablet    metoprolol  succinate (TOPROL  XL) 25 MG 24 hr tablet    11. Dizziness  R42 losartan  (COZAAR ) 50 MG tablet    12. Cardiomyopathy due to hypertension, with heart failure (HCC)  I11.0 losartan  (COZAAR ) 50 MG tablet   I43 metoprolol  succinate (TOPROL  XL) 25 MG 24 hr tablet   LVEF 45 %, on echo, mild CAD, advise changing losatan 25 to entresto  and add metoprolol  sucinate 25 daily    13. Coronary artery disease due to calcified coronary lesion  I25.10 losartan  (COZAAR ) 50 MG tablet   I25.84 metoprolol  succinate (TOPROL  XL) 25 MG 24 hr tablet   Ca score 187 on ccta with mild CAD,, advise crestor  40, instead of simvistatin 80    14. Hx of CABG  Z95.1    stable after CABG       Problem List Items Addressed This Visit       Cardiovascular and Mediastinum   Unstable angina (HCC)   Relevant Medications   losartan  (COZAAR ) 50 MG tablet   metoprolol  succinate (TOPROL  XL) 25 MG 24 hr tablet   Primary hypertension   Relevant Medications   losartan  (COZAAR ) 50 MG tablet   metoprolol  succinate (TOPROL  XL) 25 MG 24 hr tablet   Cardiomyopathy due to hypertension, with heart failure (HCC) - Primary   Relevant Medications   losartan  (COZAAR ) 50 MG tablet   metoprolol  succinate (TOPROL  XL) 25 MG 24 hr tablet   Coronary artery disease involving native coronary artery of native heart without angina pectoris   Relevant Medications   losartan  (COZAAR ) 50 MG tablet   metoprolol  succinate (TOPROL  XL) 25 MG 24 hr tablet   CHF (congestive heart failure), NYHA class I, chronic, systolic (HCC)   Relevant Medications   losartan  (COZAAR ) 50 MG tablet   metoprolol  succinate (TOPROL  XL) 25 MG 24 hr tablet     Other   Other chest pain   Relevant Medications   losartan  (COZAAR ) 50 MG tablet   metoprolol  succinate (TOPROL  XL) 25 MG 24 hr tablet   SOB (shortness of breath)    Relevant Medications   losartan  (COZAAR ) 50 MG tablet   metoprolol  succinate (TOPROL  XL) 25 MG 24 hr tablet   Mixed hyperlipidemia   Relevant Medications   losartan  (COZAAR ) 50 MG tablet   metoprolol  succinate (TOPROL  XL) 25 MG 24 hr tablet   Dizziness   Relevant Medications   losartan  (COZAAR ) 50 MG tablet   Other Visit Diagnoses       Shortness of breath       Relevant Medications   losartan  (COZAAR ) 50 MG tablet   metoprolol  succinate (TOPROL  XL) 25 MG 24 hr tablet     Coronary artery disease due to calcified coronary lesion       Ca score 187 on ccta with mild CAD,, advise crestor  40, instead of simvistatin 80   Relevant Medications   losartan  (COZAAR ) 50 MG tablet   metoprolol  succinate (TOPROL  XL) 25 MG 24 hr tablet     Hx of CABG       stable after CABG          Disposition:   Return in about 3 months (around 01/26/2024).    Total time spent: 35 minutes  Signed,  Denyse Bathe, MD  10/26/2023 10:21 AM    [facility]

## 2023-10-28 ENCOUNTER — Encounter

## 2023-10-30 ENCOUNTER — Encounter

## 2023-11-02 ENCOUNTER — Encounter

## 2023-11-04 ENCOUNTER — Encounter

## 2023-11-06 ENCOUNTER — Encounter

## 2023-11-09 ENCOUNTER — Encounter

## 2023-11-11 ENCOUNTER — Encounter

## 2023-11-13 ENCOUNTER — Encounter

## 2023-11-16 ENCOUNTER — Encounter

## 2023-11-18 ENCOUNTER — Encounter

## 2023-11-18 ENCOUNTER — Telehealth: Payer: Self-pay

## 2023-11-18 DIAGNOSIS — Z951 Presence of aortocoronary bypass graft: Secondary | ICD-10-CM

## 2023-11-18 NOTE — Progress Notes (Signed)
 Cardiac Individual Treatment Plan  Patient Details  Name: Jorge Gregory MRN: [id number] Date of Birth: March 09, 1956 Referring Provider:   Flowsheet Row Cardiac Rehab from 10/20/2023 in [facility] Cardiac and Pulmonary Rehab  Referring Provider Dr. Denyse Bathe, MD    Initial Encounter Date:  Flowsheet Row Cardiac Rehab from 10/20/2023 in [facility] Cardiac and Pulmonary Rehab  Date 10/20/23    Visit Diagnosis: S/P CABG x 3  Patient's Home Medications on Admission:  Current Outpatient Medications:    aspirin  EC 81 MG tablet, Take 1 tablet (81 mg total) by mouth daily. Swallow whole., Disp: 30 tablet, Rfl: 12   cetirizine (ZYRTEC) 10 MG tablet, Take 10 mg by mouth daily., Disp: , Rfl:    dapagliflozin  propanediol (FARXIGA ) 10 MG TABS tablet, Take 1 tablet (10 mg total) by mouth daily before breakfast., Disp: 30 tablet, Rfl: 2   ergocalciferol (VITAMIN D2) 1.25 MG (50000 UT) capsule, Take 50,000 Units by mouth once a week., Disp: , Rfl:    insulin glargine (LANTUS) 100 UNIT/ML injection, Inject 20 Units into the skin daily., Disp: , Rfl:    losartan  (COZAAR ) 50 MG tablet, Take 1 tablet (50 mg total) by mouth 2 (two) times daily., Disp: 60 tablet, Rfl: 11   metoprolol  succinate (TOPROL  XL) 25 MG 24 hr tablet, Take 1 tablet (25 mg total) by mouth daily., Disp: 30 tablet, Rfl: 3   metoprolol  tartrate (LOPRESSOR ) 25 MG tablet, Take 25 mg by mouth., Disp: , Rfl:    naproxen (NAPROSYN) 250 MG tablet, Take 250 mg by mouth 2 (two) times daily with a meal., Disp: , Rfl:    nitroGLYCERIN  (NITROSTAT ) 0.4 MG SL tablet, Place 1 tablet (0.4 mg total) under the tongue every 5 (five) minutes as needed for chest pain., Disp: 100 tablet, Rfl: 3   rosuvastatin  (CRESTOR ) 40 MG tablet, TAKE ONE TABLET BY MOUTH ONE TIME DAILY, Disp: 30 tablet, Rfl: 2   sitaGLIPtin-metformin (JANUMET) 50-1000 MG tablet, Take 1 tablet by mouth 2 (two) times daily with a meal., Disp: , Rfl:   Past Medical History: Past Medical History:   Diagnosis Date   Diabetes mellitus without complication (HCC)    Hep C w/ coma, chronic     Tobacco Use: Social History   Tobacco Use  Smoking Status Never  Smokeless Tobacco Never    Labs: Review Flowsheet  More data exists      Latest Ref Rng & Units 01/02/2005 04/07/2005 09/29/2005 02/18/2007 04/14/2007  Labs for ITP Cardiac and Pulmonary Rehab  Cholestrol 0 - 200 mg/dL - - - 838  -  LDL (calc) 0 - 99 mg/dL - - - 893  -  HDL-C >60.9 mg/dL - - - 61.9  -  Trlycerides 0 - 149 mg/dL - - - 86  -  Hemoglobin A1c 4.6 - 6.0 % 7.0  5.8  6.6  8.5  8.4      Exercise Target Goals: Exercise Program Goal: Individual exercise prescription set using results from initial 6 min walk test and THRR while considering  patient's activity barriers and safety.   Exercise Prescription Goal: Initial exercise prescription builds to 30-45 minutes a day of aerobic activity, 2-3 days per week.  Home exercise guidelines will be given to patient during program as part of exercise prescription that the participant will acknowledge.   Education: Aerobic Exercise: - Group verbal and visual presentation on the components of exercise prescription. Introduces F.I.T.T principle from ACSM for exercise prescriptions.  Reviews F.I.T.T. principles of aerobic  exercise including progression. Written material given at graduation. Flowsheet Row Cardiac Rehab from 10/20/2023 in [facility] Cardiac and Pulmonary Rehab  Education need identified 10/20/23    Education: Resistance Exercise: - Group verbal and visual presentation on the components of exercise prescription. Introduces F.I.T.T principle from ACSM for exercise prescriptions  Reviews F.I.T.T. principles of resistance exercise including progression. Written material given at graduation.    Education: Exercise & Equipment Safety: - Individual verbal instruction and demonstration of equipment use and safety with use of the equipment. Flowsheet Row Cardiac Rehab from  10/20/2023 in [facility] Cardiac and Pulmonary Rehab  Date 10/20/23  Educator NT  Instruction Review Code 1- Verbalizes Understanding    Education: Exercise Physiology & General Exercise Guidelines: - Group verbal and written instruction with models to review the exercise physiology of the cardiovascular system and associated critical values. Provides general exercise guidelines with specific guidelines to those with heart or lung disease.    Education: Flexibility, Balance, Mind/Body Relaxation: - Group verbal and visual presentation with interactive activity on the components of exercise prescription. Introduces F.I.T.T principle from ACSM for exercise prescriptions. Reviews F.I.T.T. principles of flexibility and balance exercise training including progression. Also discusses the mind body connection.  Reviews various relaxation techniques to help reduce and manage stress (i.e. Deep breathing, progressive muscle relaxation, and visualization). Balance handout provided to take home. Written material given at graduation.   Activity Barriers & Risk Stratification:  Activity Barriers & Cardiac Risk Stratification - 10/20/23 0941       Activity Barriers & Cardiac Risk Stratification   Activity Barriers Joint Problems;Chest Pain/Angina;Neck/Spine Problems;Other (comment)    Comments L hip pain, Hx neck surgeries    Cardiac Risk Stratification High          6 Minute Walk:  6 Minute Walk     Row Name 10/20/23 0940         6 Minute Walk   Phase Initial     Distance 1490 feet     Walk Time 6 minutes     # of Rest Breaks 0     MPH 2.82     METS 3.51     RPE 6     Perceived Dyspnea  0     VO2 Peak 12.29     Symptoms No     Resting HR 69 bpm     Resting BP 122/60     Resting Oxygen Saturation  98 %     Exercise Oxygen Saturation  during 6 min walk 99 %     Max Ex. HR 91 bpm     Max Ex. BP 130/64     2 Minute Post BP 120/62        Oxygen Initial Assessment:   Oxygen  Re-Evaluation:   Oxygen Discharge (Final Oxygen Re-Evaluation):   Initial Exercise Prescription:  Initial Exercise Prescription - 10/20/23 0900       Date of Initial Exercise RX and Referring Provider   Date 10/20/23    Referring Provider Dr. Denyse Bathe, MD      Oxygen   Maintain Oxygen Saturation 88% or higher      Treadmill   MPH 2.5    Grade 1    Minutes 15    METs 3.26      NuStep   Level 3    SPM 80    Minutes 15    METs 3.51      REL-XR   Level 3    Speed 50  Minutes 15    METs 3.51      Prescription Details   Frequency (times per week) 3    Duration Progress to 30 minutes of continuous aerobic without signs/symptoms of physical distress      Intensity   THRR 40-80% of Max Heartrate 103-137    Ratings of Perceived Exertion 11-13    Perceived Dyspnea 0-4      Progression   Progression Continue to progress workloads to maintain intensity without signs/symptoms of physical distress.      Resistance Training   Training Prescription Yes    Weight 5 lb    Reps 10-15          Perform Capillary Blood Glucose checks as needed.  Exercise Prescription Changes:   Exercise Prescription Changes     Row Name 10/20/23 0900 11/03/23 1100           Response to Exercise   Blood Pressure (Admit) 122/60 118/60      Blood Pressure (Exercise) 130/64 110/58      Blood Pressure (Exit) 120/62 112/58      Heart Rate (Admit) 69 bpm 75 bpm      Heart Rate (Exercise) 91 bpm 104 bpm      Heart Rate (Exit) 81 bpm 82 bpm      Oxygen Saturation (Admit) 98 % --      Oxygen Saturation (Exercise) 99 % --      Rating of Perceived Exertion (Exercise) 6 13      Perceived Dyspnea (Exercise) 0 0      Symptoms none none      Comments Results first 2 weeks of exercise      Duration -- Progress to 30 minutes of  aerobic without signs/symptoms of physical distress      Intensity -- THRR unchanged        Progression   Progression -- Continue to progress workloads  to maintain intensity without signs/symptoms of physical distress.      Average METs -- 3.26        Resistance Training   Training Prescription -- Yes      Weight -- 5lb      Reps -- 10-15        Interval Training   Interval Training -- No        Treadmill   MPH -- 2.5      Grade -- 1      Minutes -- 15      METs -- 3.26        REL-XR   Level -- 2      Minutes -- 15        Oxygen   Maintain Oxygen Saturation -- 88% or higher         Exercise Comments:   Exercise Comments     Row Name 10/26/23 0725           Exercise Comments First full day of exercise!  Patient was oriented to gym and equipment including functions, settings, policies, and procedures.  Patient's individual exercise prescription and treatment plan were reviewed.  All starting workloads were established based on the results of the 6 minute walk test done at initial orientation visit.  The plan for exercise progression was also introduced and progression will be customized based on patient's performance and goals.          Exercise Goals and Review:   Exercise Goals     Row Name 10/20/23 [phone]  Exercise Goals   Increase Physical Activity Yes       Intervention Provide advice, education, support and counseling about physical activity/exercise needs.;Develop an individualized exercise prescription for aerobic and resistive training based on initial evaluation findings, risk stratification, comorbidities and participant's personal goals.       Expected Outcomes Short Term: Attend rehab on a regular basis to increase amount of physical activity.;Long Term: Exercising regularly at least 3-5 days a week.;Long Term: Add in home exercise to make exercise part of routine and to increase amount of physical activity.       Increase Strength and Stamina Yes       Intervention Provide advice, education, support and counseling about physical activity/exercise needs.;Develop an individualized exercise  prescription for aerobic and resistive training based on initial evaluation findings, risk stratification, comorbidities and participant's personal goals.       Expected Outcomes Short Term: Increase workloads from initial exercise prescription for resistance, speed, and METs.;Long Term: Improve cardiorespiratory fitness, muscular endurance and strength as measured by increased METs and functional capacity ( );Short Term: Perform resistance training exercises routinely during rehab and add in resistance training at home       Able to understand and use rate of perceived exertion (RPE) scale Yes       Intervention Provide education and explanation on how to use RPE scale       Expected Outcomes Short Term: Able to use RPE daily in rehab to express subjective intensity level;Long Term:  Able to use RPE to guide intensity level when exercising independently       Able to understand and use Dyspnea scale Yes       Intervention Provide education and explanation on how to use Dyspnea scale       Expected Outcomes Short Term: Able to use Dyspnea scale daily in rehab to express subjective sense of shortness of breath during exertion;Long Term: Able to use Dyspnea scale to guide intensity level when exercising independently       Knowledge and understanding of Target Heart Rate Range (THRR) Yes       Intervention Provide education and explanation of THRR including how the numbers were predicted and where they are located for reference       Expected Outcomes Short Term: Able to state/look up THRR;Long Term: Able to use THRR to govern intensity when exercising independently;Short Term: Able to use daily as guideline for intensity in rehab       Able to check pulse independently Yes       Intervention Review the importance of being able to check your own pulse for safety during independent exercise;Provide education and demonstration on how to check pulse in carotid and radial arteries.       Expected Outcomes  Short Term: Able to explain why pulse checking is important during independent exercise;Long Term: Able to check pulse independently and accurately       Understanding of Exercise Prescription Yes       Intervention Provide education, explanation, and written materials on patient's individual exercise prescription       Expected Outcomes Short Term: Able to explain program exercise prescription;Long Term: Able to explain home exercise prescription to exercise independently          Exercise Goals Re-Evaluation :  Exercise Goals Re-Evaluation     Row Name 10/26/23 0725 11/03/23 1131 11/17/23 1328         Exercise Goal Re-Evaluation   Exercise Goals Review Increase Physical Activity;Able  to understand and use rate of perceived exertion (RPE) scale;Knowledge and understanding of Target Heart Rate Range (THRR);Understanding of Exercise Prescription;Increase Strength and Stamina;Able to understand and use Dyspnea scale;Able to check pulse independently Increase Physical Activity;Increase Strength and Stamina;Understanding of Exercise Prescription Increase Physical Activity;Increase Strength and Stamina;Understanding of Exercise Prescription     Comments Reviewed RPE and dyspnea scale, THR and program prescription with pt today.  Pt voiced understanding and was given a copy of goals to take home. Lymon is off to a good start in the program. He was able to attend his first and only session during this review period. During his session he was able to use the treadmill at a speed of 2. and 1% incline, and the XR at level 2. We will continue to monitor his progress in the program. Travarius has not attended rehab since the last review. He informed us  that he was going on vaction and would let us  know when he returns. We will continue to monitor his progress when he returns to the program.     Expected Outcomes Short: Use RPE daily to regulate intensity. Long: Follow program prescription in THR. Short: Continue  to follow exercise prescription. Long: Continue exercise to improve strength and stamina. Short: Return to rehab. Long: Graduate.        Discharge Exercise Prescription (Final Exercise Prescription Changes):  Exercise Prescription Changes - 11/03/23 1100       Response to Exercise   Blood Pressure (Admit) 118/60    Blood Pressure (Exercise) 110/58    Blood Pressure (Exit) 112/58    Heart Rate (Admit) 75 bpm    Heart Rate (Exercise) 104 bpm    Heart Rate (Exit) 82 bpm    Rating of Perceived Exertion (Exercise) 13    Perceived Dyspnea (Exercise) 0    Symptoms none    Comments first 2 weeks of exercise    Duration Progress to 30 minutes of  aerobic without signs/symptoms of physical distress    Intensity THRR unchanged      Progression   Progression Continue to progress workloads to maintain intensity without signs/symptoms of physical distress.    Average METs 3.26      Resistance Training   Training Prescription Yes    Weight 5lb    Reps 10-15      Interval Training   Interval Training No      Treadmill   MPH 2.5    Grade 1    Minutes 15    METs 3.26      REL-XR   Level 2    Minutes 15      Oxygen   Maintain Oxygen Saturation 88% or higher          Nutrition:  Target Goals: Understanding of nutrition guidelines, daily intake of sodium 1500mg , cholesterol 200mg , calories 30% from fat and 7% or less from saturated fats, daily to have 5 or more servings of fruits and vegetables.  Education: All About Nutrition: -Group instruction provided by verbal, written material, interactive activities, discussions, models, and posters to present general guidelines for heart healthy nutrition including fat, fiber, MyPlate, the role of sodium in heart healthy nutrition, utilization of the nutrition label, and utilization of this knowledge for meal planning. Follow up email sent as well. Written material given at graduation. Flowsheet Row Cardiac Rehab from 10/20/2023 in [facility]  Cardiac and Pulmonary Rehab  Education need identified 10/20/23    Biometrics:  Pre Biometrics - 10/20/23 [phone]  Pre Biometrics   Height 5' 9 (1.753 m)    Weight 172 lb 6.4 oz (78.2 kg)    Waist Circumference 36 inches    Hip Circumference 35 inches    Waist to Hip Ratio 1.03 %    BMI (Calculated) 25.45    Single Leg Stand 9.34 seconds           Nutrition Therapy Plan and Nutrition Goals:  Nutrition Therapy & Goals - 10/20/23 0929       Intervention Plan   Intervention Prescribe, educate and counsel regarding individualized specific dietary modifications aiming towards targeted core components such as weight, hypertension, lipid management, diabetes, heart failure and other comorbidities.    Expected Outcomes Short Term Goal: Understand basic principles of dietary content, such as calories, fat, sodium, cholesterol and nutrients.;Short Term Goal: A plan has been developed with personal nutrition goals set during dietitian appointment.;Long Term Goal: Adherence to prescribed nutrition plan.          Nutrition Assessments:  MEDIFICTS Score Key: >=70 Need to make dietary changes  40-70 Heart Healthy Diet <= 40 Therapeutic Level Cholesterol Diet  Flowsheet Row Cardiac Rehab from 10/20/2023 in [facility] Cardiac and Pulmonary Rehab  Picture Your Plate Total Score on Admission 61   Picture Your Plate Scores: <59 Unhealthy dietary pattern with much room for improvement. 41-50 Dietary pattern unlikely to meet recommendations for good health and room for improvement. 51-60 More healthful dietary pattern, with some room for improvement.  >60 Healthy dietary pattern, although there may be some specific behaviors that could be improved.    Nutrition Goals Re-Evaluation:   Nutrition Goals Discharge (Final Nutrition Goals Re-Evaluation):   Psychosocial: Target Goals: Acknowledge presence or absence of significant depression and/or stress, maximize coping skills, provide  positive support system. Participant is able to verbalize types and ability to use techniques and skills needed for reducing stress and depression.   Education: Stress, Anxiety, and Depression - Group verbal and visual presentation to define topics covered.  Reviews how body is impacted by stress, anxiety, and depression.  Also discusses healthy ways to reduce stress and to treat/manage anxiety and depression.  Written material given at graduation. Flowsheet Row Cardiac Rehab from 10/20/2023 in [facility] Cardiac and Pulmonary Rehab  Education need identified 10/20/23    Education: Sleep Hygiene -Provides group verbal and written instruction about how sleep can affect your health.  Define sleep hygiene, discuss sleep cycles and impact of sleep habits. Review good sleep hygiene tips.    Initial Review & Psychosocial Screening:  Initial Psych Review & Screening - 10/16/23 1010       Initial Review   Current issues with Current Stress Concerns    Source of Stress Concerns Financial      Family Dynamics   Good Support System? Yes      Barriers   Psychosocial barriers to participate in program The patient should benefit from training in stress management and relaxation.;There are no identifiable barriers or psychosocial needs.      Screening Interventions   Interventions Encouraged to exercise;To provide support and resources with identified psychosocial needs    Expected Outcomes Short Term goal: Utilizing psychosocial counselor, staff and physician to assist with identification of specific Stressors or current issues interfering with healing process. Setting desired goal for each stressor or current issue identified.;Long Term Goal: Stressors or current issues are controlled or eliminated.;Short Term goal: Identification and review with participant of any Quality of Life or Depression concerns found by scoring  the questionnaire.;Long Term goal: The participant improves quality of Life and PHQ9  Scores as seen by post scores and/or verbalization of changes          Quality of Life Scores:   Quality of Life - 10/20/23 0923       Quality of Life   Select Quality of Life      Quality of Life Scores   Health/Function Pre 30 %    Socioeconomic Pre 30 %    Psych/Spiritual Pre 30 %    Family Pre 30 %    GLOBAL Pre 30 %         Scores of 19 and below usually indicate a poorer quality of life in these areas.  A difference of  2-3 points is a clinically meaningful difference.  A difference of 2-3 points in the total score of the Quality of Life Index has been associated with significant improvement in overall quality of life, self-image, physical symptoms, and general health in studies assessing change in quality of life.  PHQ-9: Review Flowsheet       10/20/2023  Depression screen PHQ 2/9  Decreased Interest 0  Down, Depressed, Hopeless 0  PHQ - 2 Score 0  Altered sleeping 2  Tired, decreased energy 0  Change in appetite 0  Feeling bad or failure about yourself  0  Trouble concentrating 0  Moving slowly or fidgety/restless 0  Suicidal thoughts 0  PHQ-9 Score 2  Difficult doing work/chores Not difficult at all   Interpretation of Total Score  Total Score Depression Severity:  1-4 = Minimal depression, 5-9 = Mild depression, 10-14 = Moderate depression, 15-19 = Moderately severe depression, 20-27 = Severe depression   Psychosocial Evaluation and Intervention:  Psychosocial Evaluation - 10/16/23 1031       Psychosocial Evaluation & Interventions   Interventions Relaxation education    Comments Mr. Muckey is coming to cardiac rehab after CABG x 3. He states his biggest stressor is financial due to him not returning to work full time yet. 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 He is already back to mowing the yard, lifting weights, and other variety of household chores. He  mentions he can tell a big difference in his body since surgery, he has a lot more energy and doesn't get as angry as quick. He finds himself not speed walking everywhere and staying calm in the car while driving. He has been monitoring his blood sugar and blood pressure at home and both are within his normal range. He wants to attend the program to see how his heart is doing as he works on returning to his normal active lifestyle    Expected Outcomes Short: attend cardiac rehab for education and exercise Long: develop and miantain positive self care habits    Continue Psychosocial Services  Follow up required by staff          Psychosocial Re-Evaluation:   Psychosocial Discharge (Final Psychosocial Re-Evaluation):   Vocational Rehabilitation: Provide vocational rehab assistance to qualifying candidates.   Vocational Rehab Evaluation & Intervention:  Vocational Rehab - 10/16/23 1038       Initial Vocational Rehab Evaluation & Intervention   Assessment shows need for Vocational Rehabilitation No          Education: Education Goals: Education classes will be provided on a variety of topics geared toward better understanding of heart health and  risk factor modification. Participant will state understanding/return demonstration of topics presented as noted by education test scores.  Learning Barriers/Preferences:  Learning Barriers/Preferences - 10/16/23 1008       Learning Barriers/Preferences   Learning Barriers None    Learning Preferences None          General Cardiac Education Topics:  AED/CPR: - Group verbal and written instruction with the use of models to demonstrate the basic use of the AED with the basic ABC's of resuscitation.   Anatomy and Cardiac Procedures: - Group verbal and visual presentation and models provide information about basic cardiac anatomy and function. Reviews the testing methods done to diagnose heart disease and the outcomes of the test  results. Describes the treatment choices: Medical Management, Angioplasty, or Coronary Bypass Surgery for treating various heart conditions including Myocardial Infarction, Angina, Valve Disease, and Cardiac Arrhythmias.  Written material given at graduation. Flowsheet Row Cardiac Rehab from 10/20/2023 in [facility] Cardiac and Pulmonary Rehab  Education need identified 10/20/23    Medication Safety: - Group verbal and visual instruction to review commonly prescribed medications for heart and lung disease. Reviews the medication, class of the drug, and side effects. Includes the steps to properly store meds and maintain the prescription regimen.  Written material given at graduation.   Intimacy: - Group verbal instruction through game format to discuss how heart and lung disease can affect sexual intimacy. Written material given at graduation..   Know Your Numbers and Heart Failure: - Group verbal and visual instruction to discuss disease risk factors for cardiac and pulmonary disease and treatment options.  Reviews associated critical values for Overweight/Obesity, Hypertension, Cholesterol, and Diabetes.  Discusses basics of heart failure: signs/symptoms and treatments.  Introduces Heart Failure Zone chart for action plan for heart failure.  Written material given at graduation. Flowsheet Row Cardiac Rehab from 10/20/2023 in [facility] Cardiac and Pulmonary Rehab  Education need identified 10/20/23    Infection Prevention: - Provides verbal and written material to individual with discussion of infection control including proper hand washing and proper equipment cleaning during exercise session. Flowsheet Row Cardiac Rehab from 10/20/2023 in [facility] Cardiac and Pulmonary Rehab  Date 10/20/23  Educator NT  Instruction Review Code 1- Verbalizes Understanding    Falls Prevention: - Provides verbal and written material to individual with discussion of falls prevention and safety. Flowsheet Row Cardiac Rehab  from 10/20/2023 in [facility] Cardiac and Pulmonary Rehab  Date 10/20/23  Educator NT  Instruction Review Code 1- Verbalizes Understanding    Other: -Provides group and verbal instruction on various topics (see comments)   Knowledge Questionnaire Score:  Knowledge Questionnaire Score - 10/20/23 0925       Knowledge Questionnaire Score   Pre Score 15/26          Core Components/Risk Factors/Patient Goals at Admission:  Personal Goals and Risk Factors at Admission - 10/16/23 1038       Core Components/Risk Factors/Patient Goals on Admission   Diabetes Yes    Intervention Provide education about signs/symptoms and action to take for hypo/hyperglycemia.;Provide education about proper nutrition, including hydration, and aerobic/resistive exercise prescription along with prescribed medications to achieve blood glucose in normal ranges: Fasting glucose 65-99 mg/dL    Expected Outcomes Short Term: Participant verbalizes understanding of the signs/symptoms and immediate care of hyper/hypoglycemia, proper foot care and importance of medication, aerobic/resistive exercise and nutrition plan for blood glucose control.;Long Term: Attainment of HbA1C < 7%.    Hypertension Yes    Intervention Provide  education on lifestyle modifcations including regular physical activity/exercise, weight management, moderate sodium restriction and increased consumption of fresh fruit, vegetables, and low fat dairy, alcohol moderation, and smoking cessation.;Monitor prescription use compliance.    Expected Outcomes Short Term: Continued assessment and intervention until BP is < 140/31mm HG in hypertensive participants. < 130/18mm HG in hypertensive participants with diabetes, heart failure or chronic kidney disease.;Long Term: Maintenance of blood pressure at goal levels.    Lipids Yes    Intervention Provide education and support for participant on nutrition & aerobic/resistive exercise along with prescribed medications to  achieve LDL 70mg , HDL >40mg .    Expected Outcomes Short Term: Participant states understanding of desired cholesterol values and is compliant with medications prescribed. Participant is following exercise prescription and nutrition guidelines.;Long Term: Cholesterol controlled with medications as prescribed, with individualized exercise RX and with personalized nutrition plan. Value goals: LDL < 70mg , HDL > 40 mg.          Education:Diabetes - Individual verbal and written instruction to review signs/symptoms of diabetes, desired ranges of glucose level fasting, after meals and with exercise. Acknowledge that pre and post exercise glucose checks will be done for 3 sessions at entry of program. Flowsheet Row Cardiac Rehab from 10/20/2023 in [facility] Cardiac and Pulmonary Rehab  Date 10/20/23  Educator NT  Instruction Review Code 1- Verbalizes Understanding    Core Components/Risk Factors/Patient Goals Review:    Core Components/Risk Factors/Patient Goals at Discharge (Final Review):    ITP Comments:  ITP Comments     Row Name 10/16/23 1030 10/20/23 0921 10/21/23 1056 10/26/23 0725 11/18/23 1006   ITP Comments Initial phone call completed. Diagnosis can be found in [facility] 5/14. EP Orientation scheduled for Tuesday 7/1 at 8am. Completed and gym orientation for cardiac rehab. Initial ITP created and sent for review to Dr. Oneil Pinal, Medical Director. 30 Day review completed. Medical Director ITP review done, changes made as directed, and signed approval by Medical Director. First full day of exercise!  Patient was oriented to gym and equipment including functions, settings, policies, and procedures.  Patient's individual exercise prescription and treatment plan were reviewed.  All starting workloads were established based on the results of the 6 minute walk test done at initial orientation visit.  The plan for exercise progression was also introduced and progression will be customized based on  patient's performance and goals. 30 Day review completed. Medical Director ITP review done, changes made as directed, and signed approval by Medical Director. New to program.      Comments: 30 day review

## 2023-11-18 NOTE — Telephone Encounter (Signed)
 Pt states his Farxiga  is making him extremely dizzy. Pt has stopped taking it and is asking for an alternative/recommendation. Please advise.

## 2023-11-20 ENCOUNTER — Encounter

## 2023-11-23 ENCOUNTER — Encounter

## 2023-11-23 NOTE — Telephone Encounter (Signed)
 Patient infomed and will send Bps back after a week of taking the losartan  bid

## 2023-11-25 ENCOUNTER — Encounter: Attending: Cardiovascular Disease

## 2023-11-25 DIAGNOSIS — Z951 Presence of aortocoronary bypass graft: Secondary | ICD-10-CM | POA: Insufficient documentation

## 2023-11-27 ENCOUNTER — Encounter

## 2023-11-27 ENCOUNTER — Telehealth: Payer: Self-pay | Admitting: Internal Medicine

## 2023-11-27 NOTE — Telephone Encounter (Signed)
 Pt has NPE appt with NK coming up in September but is out of the  Toujeo solo star 300 units ML. Pt does 55 units, need sent to [facility] Pt stated even if he can just get one pen that would hold him off till September

## 2023-11-30 ENCOUNTER — Encounter

## 2023-11-30 DIAGNOSIS — Z951 Presence of aortocoronary bypass graft: Secondary | ICD-10-CM

## 2023-11-30 NOTE — Progress Notes (Deleted)
 error

## 2023-11-30 NOTE — Progress Notes (Signed)
 Early Discharge Summary  Jorge Gregory 1955/08/01  Jorge Gregory is discharging early due to lack of attendance. He completed 1 of 36 sessions.    6 Minute Walk     Row Name 10/20/23 0940         6 Minute Walk   Phase Initial     Distance 1490 feet     Walk Time 6 minutes     # of Rest Breaks 0     MPH 2.82     METS 3.51     RPE 6     Perceived Dyspnea  0     VO2 Peak 12.29     Symptoms No     Resting HR 69 bpm     Resting BP 122/60     Resting Oxygen Saturation  98 %     Exercise Oxygen Saturation  during 6 min walk 99 %     Max Ex. HR 91 bpm     Max Ex. BP 130/64     2 Minute Post BP 120/62

## 2023-11-30 NOTE — Progress Notes (Signed)
 Cardiac Individual Treatment Plan  Patient Details  Name: CLEMENTS TORO MRN: [id number] Date of Birth: [birth year] Referring Provider:   Flowsheet Row Cardiac Rehab from 10/20/2023 in [facility] Cardiac and Pulmonary Rehab  Referring Provider Dr. Denyse Bathe, MD    Initial Encounter Date:  Flowsheet Row Cardiac Rehab from 10/20/2023 in [facility] Cardiac and Pulmonary Rehab  Date 10/20/23    Visit Diagnosis: S/P CABG x 3  Patient's Home Medications on Admission:  Current Outpatient Medications:    aspirin  EC 81 MG tablet, Take 1 tablet (81 mg total) by mouth daily. Swallow whole., Disp: 30 tablet, Rfl: 12   cetirizine (ZYRTEC) 10 MG tablet, Take 10 mg by mouth daily., Disp: , Rfl:    dapagliflozin  propanediol (FARXIGA ) 10 MG TABS tablet, Take 1 tablet (10 mg total) by mouth daily before breakfast., Disp: 30 tablet, Rfl: 2   ergocalciferol (VITAMIN D2) 1.25 MG (50000 UT) capsule, Take 50,000 Units by mouth once a week., Disp: , Rfl:    insulin glargine  (LANTUS) 100 UNIT/ML injection, Inject 20 Units into the skin daily., Disp: , Rfl:    losartan  (COZAAR ) 50 MG tablet, Take 1 tablet (50 mg total) by mouth 2 (two) times daily., Disp: 60 tablet, Rfl: 11   metoprolol  succinate (TOPROL  XL) 25 MG 24 hr tablet, Take 1 tablet (25 mg total) by mouth daily., Disp: 30 tablet, Rfl: 3   metoprolol  tartrate (LOPRESSOR ) 25 MG tablet, Take 25 mg by mouth., Disp: , Rfl:    naproxen (NAPROSYN) 250 MG tablet, Take 250 mg by mouth 2 (two) times daily with a meal., Disp: , Rfl:    nitroGLYCERIN  (NITROSTAT ) 0.4 MG SL tablet, Place 1 tablet (0.4 mg total) under the tongue every 5 (five) minutes as needed for chest pain., Disp: 100 tablet, Rfl: 3   rosuvastatin  (CRESTOR ) 40 MG tablet, TAKE ONE TABLET BY MOUTH ONE TIME DAILY, Disp: 30 tablet, Rfl: 2   sitaGLIPtin -metformin  (JANUMET ) 50-1000 MG tablet, Take 1 tablet by mouth 2 (two) times daily with a meal., Disp: , Rfl:   Past Medical History: Past Medical History:   Diagnosis Date   Diabetes mellitus without complication (HCC)    Hep C w/ coma, chronic     Tobacco Use: Social History   Tobacco Use  Smoking Status Never  Smokeless Tobacco Never    Labs: Review Flowsheet  More data exists      Latest Ref Rng & Units 01/02/2005 04/07/2005 09/29/2005 02/18/2007 04/14/2007  Labs for ITP Cardiac and Pulmonary Rehab  Cholestrol 0 - 200 mg/dL - - - 838  -  LDL (calc) 0 - 99 mg/dL - - - 893  -  HDL-C >60.9 mg/dL - - - 61.9  -  Trlycerides 0 - 149 mg/dL - - - 86  -  Hemoglobin A1c 4.6 - 6.0 % 7.0  5.8  6.6  8.5  8.4      Exercise Target Goals: Exercise Program Goal: Individual exercise prescription set using results from initial 6 min walk test and THRR while considering  patient's activity barriers and safety.   Exercise Prescription Goal: Initial exercise prescription builds to 30-45 minutes a day of aerobic activity, 2-3 days per week.  Home exercise guidelines will be given to patient during program as part of exercise prescription that the participant will acknowledge.   Education: Aerobic Exercise: - Group verbal and visual presentation on the components of exercise prescription. Introduces F.I.T.T principle from ACSM for exercise prescriptions.  Reviews F.I.T.T. principles of aerobic  exercise including progression. Written material provided at class time. Flowsheet Row Cardiac Rehab from 10/20/2023 in [facility] Cardiac and Pulmonary Rehab  Education need identified 10/20/23    Education: Resistance Exercise: - Group verbal and visual presentation on the components of exercise prescription. Introduces F.I.T.T principle from ACSM for exercise prescriptions  Reviews F.I.T.T. principles of resistance exercise including progression. Written material provided at class time.    Education: Exercise & Equipment Safety: - Individual verbal instruction and demonstration of equipment use and safety with use of the equipment. Flowsheet Row Cardiac Rehab  from 10/20/2023 in [facility] Cardiac and Pulmonary Rehab  Date 10/20/23  Educator NT  Instruction Review Code 1- Verbalizes Understanding    Education: Exercise Physiology & General Exercise Guidelines: - Group verbal and written instruction with models to review the exercise physiology of the cardiovascular system and associated critical values. Provides general exercise guidelines with specific guidelines to those with heart or lung disease. Written material provided at class time.   Education: Flexibility, Balance, Mind/Body Relaxation: - Group verbal and visual presentation with interactive activity on the components of exercise prescription. Introduces F.I.T.T principle from ACSM for exercise prescriptions. Reviews F.I.T.T. principles of flexibility and balance exercise training including progression. Also discusses the mind body connection.  Reviews various relaxation techniques to help reduce and manage stress (i.e. Deep breathing, progressive muscle relaxation, and visualization). Balance handout provided to take home. Written material provided at class time.   Activity Barriers & Risk Stratification:  Activity Barriers & Cardiac Risk Stratification - 10/20/23 0941       Activity Barriers & Cardiac Risk Stratification   Activity Barriers Joint Problems;Chest Pain/Angina;Neck/Spine Problems;Other (comment)    Comments L hip pain, Hx neck surgeries    Cardiac Risk Stratification High          6 Minute Walk:  6 Minute Walk     Row Name 10/20/23 0940         6 Minute Walk   Phase Initial     Distance 1490 feet     Walk Time 6 minutes     # of Rest Breaks 0     MPH 2.82     METS 3.51     RPE 6     Perceived Dyspnea  0     VO2 Peak 12.29     Symptoms No     Resting HR 69 bpm     Resting BP 122/60     Resting Oxygen Saturation  98 %     Exercise Oxygen Saturation  during 6 min walk 99 %     Max Ex. HR 91 bpm     Max Ex. BP 130/64     2 Minute Post BP 120/62         Oxygen Initial Assessment:   Oxygen Re-Evaluation:   Oxygen Discharge (Final Oxygen Re-Evaluation):   Initial Exercise Prescription:  Initial Exercise Prescription - 10/20/23 0900       Date of Initial Exercise RX and Referring Provider   Date 10/20/23    Referring Provider Dr. Denyse Bathe, MD      Oxygen   Maintain Oxygen Saturation 88% or higher      Treadmill   MPH 2.5    Grade 1    Minutes 15    METs 3.26      NuStep   Level 3    SPM 80    Minutes 15    METs 3.51      REL-XR  Level 3    Speed 50    Minutes 15    METs 3.51      Prescription Details   Frequency (times per week) 3    Duration Progress to 30 minutes of continuous aerobic without signs/symptoms of physical distress      Intensity   THRR 40-80% of Max Heartrate 103-137    Ratings of Perceived Exertion 11-13    Perceived Dyspnea 0-4      Progression   Progression Continue to progress workloads to maintain intensity without signs/symptoms of physical distress.      Resistance Training   Training Prescription Yes    Weight 5 lb    Reps 10-15          Perform Capillary Blood Glucose checks as needed.  Exercise Prescription Changes:   Exercise Prescription Changes     Row Name 10/20/23 0900 11/03/23 1100           Response to Exercise   Blood Pressure (Admit) 122/60 118/60      Blood Pressure (Exercise) 130/64 110/58      Blood Pressure (Exit) 120/62 112/58      Heart Rate (Admit) 69 bpm 75 bpm      Heart Rate (Exercise) 91 bpm 104 bpm      Heart Rate (Exit) 81 bpm 82 bpm      Oxygen Saturation (Admit) 98 % --      Oxygen Saturation (Exercise) 99 % --      Rating of Perceived Exertion (Exercise) 6 13      Perceived Dyspnea (Exercise) 0 0      Symptoms none none      Comments Results first 2 weeks of exercise      Duration -- Progress to 30 minutes of  aerobic without signs/symptoms of physical distress      Intensity -- THRR unchanged        Progression    Progression -- Continue to progress workloads to maintain intensity without signs/symptoms of physical distress.      Average METs -- 3.26        Resistance Training   Training Prescription -- Yes      Weight -- 5lb      Reps -- 10-15        Interval Training   Interval Training -- No        Treadmill   MPH -- 2.5      Grade -- 1      Minutes -- 15      METs -- 3.26        REL-XR   Level -- 2      Minutes -- 15        Oxygen   Maintain Oxygen Saturation -- 88% or higher         Exercise Comments:   Exercise Comments     Row Name 10/26/23 0725           Exercise Comments First full day of exercise!  Patient was oriented to gym and equipment including functions, settings, policies, and procedures.  Patient's individual exercise prescription and treatment plan were reviewed.  All starting workloads were established based on the results of the 6 minute walk test done at initial orientation visit.  The plan for exercise progression was also introduced and progression will be customized based on patient's performance and goals.          Exercise Goals and Review:   Exercise Goals  Row Name 10/20/23 0942             Exercise Goals   Increase Physical Activity Yes       Intervention Provide advice, education, support and counseling about physical activity/exercise needs.;Develop an individualized exercise prescription for aerobic and resistive training based on initial evaluation findings, risk stratification, comorbidities and participant's personal goals.       Expected Outcomes Short Term: Attend rehab on a regular basis to increase amount of physical activity.;Long Term: Exercising regularly at least 3-5 days a week.;Long Term: Add in home exercise to make exercise part of routine and to increase amount of physical activity.       Increase Strength and Stamina Yes       Intervention Provide advice, education, support and counseling about physical activity/exercise  needs.;Develop an individualized exercise prescription for aerobic and resistive training based on initial evaluation findings, risk stratification, comorbidities and participant's personal goals.       Expected Outcomes Short Term: Increase workloads from initial exercise prescription for resistance, speed, and METs.;Long Term: Improve cardiorespiratory fitness, muscular endurance and strength as measured by increased METs and functional capacity ( );Short Term: Perform resistance training exercises routinely during rehab and add in resistance training at home       Able to understand and use rate of perceived exertion (RPE) scale Yes       Intervention Provide education and explanation on how to use RPE scale       Expected Outcomes Short Term: Able to use RPE daily in rehab to express subjective intensity level;Long Term:  Able to use RPE to guide intensity level when exercising independently       Able to understand and use Dyspnea scale Yes       Intervention Provide education and explanation on how to use Dyspnea scale       Expected Outcomes Short Term: Able to use Dyspnea scale daily in rehab to express subjective sense of shortness of breath during exertion;Long Term: Able to use Dyspnea scale to guide intensity level when exercising independently       Knowledge and understanding of Target Heart Rate Range (THRR) Yes       Intervention Provide education and explanation of THRR including how the numbers were predicted and where they are located for reference       Expected Outcomes Short Term: Able to state/look up THRR;Long Term: Able to use THRR to govern intensity when exercising independently;Short Term: Able to use daily as guideline for intensity in rehab       Able to check pulse independently Yes       Intervention Review the importance of being able to check your own pulse for safety during independent exercise;Provide education and demonstration on how to check pulse in carotid and  radial arteries.       Expected Outcomes Short Term: Able to explain why pulse checking is important during independent exercise;Long Term: Able to check pulse independently and accurately       Understanding of Exercise Prescription Yes       Intervention Provide education, explanation, and written materials on patient's individual exercise prescription       Expected Outcomes Short Term: Able to explain program exercise prescription;Long Term: Able to explain home exercise prescription to exercise independently          Exercise Goals Re-Evaluation :  Exercise Goals Re-Evaluation     Row Name 10/26/23 0725 11/03/23 1131 11/17/23 1328  Exercise Goal Re-Evaluation   Exercise Goals Review Increase Physical Activity;Able to understand and use rate of perceived exertion (RPE) scale;Knowledge and understanding of Target Heart Rate Range (THRR);Understanding of Exercise Prescription;Increase Strength and Stamina;Able to understand and use Dyspnea scale;Able to check pulse independently Increase Physical Activity;Increase Strength and Stamina;Understanding of Exercise Prescription Increase Physical Activity;Increase Strength and Stamina;Understanding of Exercise Prescription     Comments Reviewed RPE and dyspnea scale, THR and program prescription with pt today.  Pt voiced understanding and was given a copy of goals to take home. Sonya is off to a good start in the program. He was able to attend his first and only session during this review period. During his session he was able to use the treadmill at a speed of 2. and 1% incline, and the XR at level 2. We will continue to monitor his progress in the program. Jonaven has not attended rehab since the last review. He informed us  that he was going on vaction and would let us  know when he returns. We will continue to monitor his progress when he returns to the program.     Expected Outcomes Short: Use RPE daily to regulate intensity. Long: Follow  program prescription in THR. Short: Continue to follow exercise prescription. Long: Continue exercise to improve strength and stamina. Short: Return to rehab. Long: Graduate.        Discharge Exercise Prescription (Final Exercise Prescription Changes):  Exercise Prescription Changes - 11/03/23 1100       Response to Exercise   Blood Pressure (Admit) 118/60    Blood Pressure (Exercise) 110/58    Blood Pressure (Exit) 112/58    Heart Rate (Admit) 75 bpm    Heart Rate (Exercise) 104 bpm    Heart Rate (Exit) 82 bpm    Rating of Perceived Exertion (Exercise) 13    Perceived Dyspnea (Exercise) 0    Symptoms none    Comments first 2 weeks of exercise    Duration Progress to 30 minutes of  aerobic without signs/symptoms of physical distress    Intensity THRR unchanged      Progression   Progression Continue to progress workloads to maintain intensity without signs/symptoms of physical distress.    Average METs 3.26      Resistance Training   Training Prescription Yes    Weight 5lb    Reps 10-15      Interval Training   Interval Training No      Treadmill   MPH 2.5    Grade 1    Minutes 15    METs 3.26      REL-XR   Level 2    Minutes 15      Oxygen   Maintain Oxygen Saturation 88% or higher          Nutrition:  Target Goals: Understanding of nutrition guidelines, daily intake of sodium 1500mg , cholesterol 200mg , calories 30% from fat and 7% or less from saturated fats, daily to have 5 or more servings of fruits and vegetables.  Education: Nutrition 1 -Group instruction provided by verbal, written material, interactive activities, discussions, models, and posters to present general guidelines for heart healthy nutrition including macronutrients, label reading, and promoting whole foods over processed counterparts. Education serves as Pensions consultant of discussion of heart healthy eating for all. Written material provided at class time.    Education: Nutrition 2 -Group  instruction provided by verbal, written material, interactive activities, discussions, models, and posters to present general guidelines for heart healthy nutrition  including sodium, cholesterol, and saturated fat. Providing guidance of habit forming to improve blood pressure, cholesterol, and body weight. Written material provided at class time.     Biometrics:  Pre Biometrics - 10/20/23 0943       Pre Biometrics   Height 5' 9 (1.753 m)    Weight 172 lb 6.4 oz (78.2 kg)    Waist Circumference 36 inches    Hip Circumference 35 inches    Waist to Hip Ratio 1.03 %    BMI (Calculated) 25.45    Single Leg Stand 9.34 seconds           Nutrition Therapy Plan and Nutrition Goals:  Nutrition Therapy & Goals - 10/20/23 0929       Intervention Plan   Intervention Prescribe, educate and counsel regarding individualized specific dietary modifications aiming towards targeted core components such as weight, hypertension, lipid management, diabetes, heart failure and other comorbidities.    Expected Outcomes Short Term Goal: Understand basic principles of dietary content, such as calories, fat, sodium, cholesterol and nutrients.;Short Term Goal: A plan has been developed with personal nutrition goals set during dietitian appointment.;Long Term Goal: Adherence to prescribed nutrition plan.          Nutrition Assessments:  MEDIFICTS Score Key: >=70 Need to make dietary changes  40-70 Heart Healthy Diet <= 40 Therapeutic Level Cholesterol Diet  Flowsheet Row Cardiac Rehab from 10/20/2023 in [facility] Cardiac and Pulmonary Rehab  Picture Your Plate Total Score on Admission 61   Picture Your Plate Scores: <59 Unhealthy dietary pattern with much room for improvement. 41-50 Dietary pattern unlikely to meet recommendations for good health and room for improvement. 51-60 More healthful dietary pattern, with some room for improvement.  >60 Healthy dietary pattern, although there may be some  specific behaviors that could be improved.    Nutrition Goals Re-Evaluation:   Nutrition Goals Discharge (Final Nutrition Goals Re-Evaluation):   Psychosocial: Target Goals: Acknowledge presence or absence of significant depression and/or stress, maximize coping skills, provide positive support system. Participant is able to verbalize types and ability to use techniques and skills needed for reducing stress and depression.   Education: Stress, Anxiety, and Depression - Group verbal and visual presentation to define topics covered.  Reviews how body is impacted by stress, anxiety, and depression.  Also discusses healthy ways to reduce stress and to treat/manage anxiety and depression. Written material provided at class time. Flowsheet Row Cardiac Rehab from 10/20/2023 in [facility] Cardiac and Pulmonary Rehab  Education need identified 10/20/23    Education: Sleep Hygiene -Provides group verbal and written instruction about how sleep can affect your health.  Define sleep hygiene, discuss sleep cycles and impact of sleep habits. Review good sleep hygiene tips.   Initial Review & Psychosocial Screening:  Initial Psych Review & Screening - 10/16/23 1010       Initial Review   Current issues with Current Stress Concerns    Source of Stress Concerns Financial      Family Dynamics   Good Support System? Yes      Barriers   Psychosocial barriers to participate in program The patient should benefit from training in stress management and relaxation.;There are no identifiable barriers or psychosocial needs.      Screening Interventions   Interventions Encouraged to exercise;To provide support and resources with identified psychosocial needs    Expected Outcomes Short Term goal: Utilizing psychosocial counselor, staff and physician to assist with identification of specific Stressors or current issues interfering  with healing process. Setting desired goal for each stressor or current issue  identified.;Long Term Goal: Stressors or current issues are controlled or eliminated.;Short Term goal: Identification and review with participant of any Quality of Life or Depression concerns found by scoring the questionnaire.;Long Term goal: The participant improves quality of Life and PHQ9 Scores as seen by post scores and/or verbalization of changes          Quality of Life Scores:   Quality of Life - 10/20/23 0923       Quality of Life   Select Quality of Life      Quality of Life Scores   Health/Function Pre 30 %    Socioeconomic Pre 30 %    Psych/Spiritual Pre 30 %    Family Pre 30 %    GLOBAL Pre 30 %         Scores of 19 and below usually indicate a poorer quality of life in these areas.  A difference of  2-3 points is a clinically meaningful difference.  A difference of 2-3 points in the total score of the Quality of Life Index has been associated with significant improvement in overall quality of life, self-image, physical symptoms, and general health in studies assessing change in quality of life.  PHQ-9: Review Flowsheet       10/20/2023  Depression screen PHQ 2/9  Decreased Interest 0  Down, Depressed, Hopeless 0  PHQ - 2 Score 0  Altered sleeping 2  Tired, decreased energy 0  Change in appetite 0  Feeling bad or failure about yourself  0  Trouble concentrating 0  Moving slowly or fidgety/restless 0  Suicidal thoughts 0  PHQ-9 Score 2  Difficult doing work/chores Not difficult at all   Interpretation of Total Score  Total Score Depression Severity:  1-4 = Minimal depression, 5-9 = Mild depression, 10-14 = Moderate depression, 15-19 = Moderately severe depression, 20-27 = Severe depression   Psychosocial Evaluation and Intervention:  Psychosocial Evaluation - 10/16/23 1031       Psychosocial Evaluation & Interventions   Interventions Relaxation education    Comments Mr. Goodie is coming to cardiac rehab after CABG x 3. He states his biggest stressor is  financial due to him not returning to work full time yet. 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 He is already back to mowing the yard, lifting weights, and other variety of household chores. He mentions he can tell a big difference in his body since surgery, he has a lot more energy and doesn't get as angry as quick. He finds himself not speed walking everywhere and staying calm in the car while driving. He has been monitoring his blood sugar and blood pressure at home and both are within his normal range. He wants to attend the program to see how his heart is doing as he works on returning to his normal active lifestyle    Expected Outcomes Short: attend cardiac rehab for education and exercise Long: develop and miantain positive self care habits    Continue Psychosocial Services  Follow up required by staff          Psychosocial Re-Evaluation:   Psychosocial Discharge (Final Psychosocial Re-Evaluation):   Vocational Rehabilitation: Provide vocational rehab assistance to qualifying candidates.   Vocational Rehab Evaluation & Intervention:  Vocational Rehab - 10/16/23 1038       Initial Vocational Rehab Evaluation &  Intervention   Assessment shows need for Vocational Rehabilitation No          Education: Education Goals: Education classes will be provided on a variety of topics geared toward better understanding of heart health and risk factor modification. Participant will state understanding/return demonstration of topics presented as noted by education test scores.  Learning Barriers/Preferences:  Learning Barriers/Preferences - 10/16/23 1008       Learning Barriers/Preferences   Learning Barriers None    Learning Preferences None          General Cardiac Education Topics:  AED/CPR: - Group verbal and written instruction with the use of models to demonstrate the basic use of  the AED with the basic ABC's of resuscitation.   Test and Procedures: - Group verbal and visual presentation and models provide information about basic cardiac anatomy and function. Reviews the testing methods done to diagnose heart disease and the outcomes of the test results. Describes the treatment choices: Medical Management, Angioplasty, or Coronary Bypass Surgery for treating various heart conditions including Myocardial Infarction, Angina, Valve Disease, and Cardiac Arrhythmias. Written material provided at class time. Flowsheet Row Cardiac Rehab from 10/20/2023 in [facility] Cardiac and Pulmonary Rehab  Education need identified 10/20/23    Medication Safety: - Group verbal and visual instruction to review commonly prescribed medications for heart and lung disease. Reviews the medication, class of the drug, and side effects. Includes the steps to properly store meds and maintain the prescription regimen. Written material provided at class time.   Intimacy: - Group verbal instruction through game format to discuss how heart and lung disease can affect sexual intimacy. Written material provided at class time.   Know Your Numbers and Heart Failure: - Group verbal and visual instruction to discuss disease risk factors for cardiac and pulmonary disease and treatment options.  Reviews associated critical values for Overweight/Obesity, Hypertension, Cholesterol, and Diabetes.  Discusses basics of heart failure: signs/symptoms and treatments.  Introduces Heart Failure Zone chart for action plan for heart failure. Written material provided at class time. Flowsheet Row Cardiac Rehab from 10/20/2023 in [facility] Cardiac and Pulmonary Rehab  Education need identified 10/20/23    Infection Prevention: - Provides verbal and written material to individual with discussion of infection control including proper hand washing and proper equipment cleaning during exercise session. Flowsheet Row Cardiac Rehab from  10/20/2023 in [facility] Cardiac and Pulmonary Rehab  Date 10/20/23  Educator NT  Instruction Review Code 1- Verbalizes Understanding    Falls Prevention: - Provides verbal and written material to individual with discussion of falls prevention and safety. Flowsheet Row Cardiac Rehab from 10/20/2023 in [facility] Cardiac and Pulmonary Rehab  Date 10/20/23  Educator NT  Instruction Review Code 1- Verbalizes Understanding    Other: -Provides group and verbal instruction on various topics (see comments)   Knowledge Questionnaire Score:  Knowledge Questionnaire Score - 10/20/23 0925       Knowledge Questionnaire Score   Pre Score 15/26          Core Components/Risk Factors/Patient Goals at Admission:  Personal Goals and Risk Factors at Admission - 10/16/23 1038       Core Components/Risk Factors/Patient Goals on Admission   Diabetes Yes    Intervention Provide education about signs/symptoms and action to take for hypo/hyperglycemia.;Provide education about proper nutrition, including hydration, and aerobic/resistive exercise prescription along with prescribed medications to achieve blood glucose in normal ranges: Fasting glucose 65-99 mg/dL    Expected Outcomes Short Term: Participant verbalizes understanding  of the signs/symptoms and immediate care of hyper/hypoglycemia, proper foot care and importance of medication, aerobic/resistive exercise and nutrition plan for blood glucose control.;Long Term: Attainment of HbA1C < 7%.    Hypertension Yes    Intervention Provide education on lifestyle modifcations including regular physical activity/exercise, weight management, moderate sodium restriction and increased consumption of fresh fruit, vegetables, and low fat dairy, alcohol moderation, and smoking cessation.;Monitor prescription use compliance.    Expected Outcomes Short Term: Continued assessment and intervention until BP is < 140/20mm HG in hypertensive participants. < 130/15mm HG in  hypertensive participants with diabetes, heart failure or chronic kidney disease.;Long Term: Maintenance of blood pressure at goal levels.    Lipids Yes    Intervention Provide education and support for participant on nutrition & aerobic/resistive exercise along with prescribed medications to achieve LDL 70mg , HDL >40mg .    Expected Outcomes Short Term: Participant states understanding of desired cholesterol values and is compliant with medications prescribed. Participant is following exercise prescription and nutrition guidelines.;Long Term: Cholesterol controlled with medications as prescribed, with individualized exercise RX and with personalized nutrition plan. Value goals: LDL < 70mg , HDL > 40 mg.          Education:Diabetes - Individual verbal and written instruction to review signs/symptoms of diabetes, desired ranges of glucose level fasting, after meals and with exercise. Acknowledge that pre and post exercise glucose checks will be done for 3 sessions at entry of program. Flowsheet Row Cardiac Rehab from 10/20/2023 in [facility] Cardiac and Pulmonary Rehab  Date 10/20/23  Educator NT  Instruction Review Code 1- Verbalizes Understanding    Core Components/Risk Factors/Patient Goals Review:    Core Components/Risk Factors/Patient Goals at Discharge (Final Review):    ITP Comments:  ITP Comments     Row Name 10/16/23 1030 10/20/23 0921 10/21/23 1056 10/26/23 0725 11/18/23 1006   ITP Comments Initial phone call completed. Diagnosis can be found in [facility] 5/14. EP Orientation scheduled for Tuesday 7/1 at 8am. Completed and gym orientation for cardiac rehab. Initial ITP created and sent for review to Dr. Oneil Pinal, Medical Director. 30 Day review completed. Medical Director ITP review done, changes made as directed, and signed approval by Medical Director. First full day of exercise!  Patient was oriented to gym and equipment including functions, settings, policies, and procedures.   Patient's individual exercise prescription and treatment plan were reviewed.  All starting workloads were established based on the results of the 6 minute walk test done at initial orientation visit.  The plan for exercise progression was also introduced and progression will be customized based on patient's performance and goals. 30 Day review completed. Medical Director ITP review done, changes made as directed, and signed approval by Medical Director. New to program.    Row Name 11/30/23 1059           ITP Comments Patient being discharged due to lack of attendance. Staff has tried contacting patient with no response. He completed 1 of 36 sessions          Comments: early  discharge

## 2023-12-02 ENCOUNTER — Encounter

## 2023-12-03 ENCOUNTER — Other Ambulatory Visit: Payer: Self-pay

## 2023-12-03 MED ORDER — TOUJEO MAX SOLOSTAR 300 UNIT/ML ~~LOC~~ SOPN: 25.0000 [IU] | PEN_INJECTOR | Freq: Two times a day (BID) | SUBCUTANEOUS | 0 refills | Status: DC

## 2023-12-03 MED ORDER — SITAGLIPTIN PHOS-METFORMIN HCL 50-1000 MG PO TABS: 1.0000 | ORAL_TABLET | Freq: Two times a day (BID) | ORAL | 0 refills | Status: DC

## 2023-12-04 ENCOUNTER — Encounter

## 2023-12-07 ENCOUNTER — Encounter

## 2023-12-09 ENCOUNTER — Encounter

## 2023-12-11 ENCOUNTER — Encounter

## 2023-12-14 ENCOUNTER — Encounter

## 2023-12-16 ENCOUNTER — Encounter

## 2023-12-18 ENCOUNTER — Encounter

## 2023-12-22 ENCOUNTER — Ambulatory Visit (INDEPENDENT_AMBULATORY_CARE_PROVIDER_SITE_OTHER): Admitting: Internal Medicine

## 2023-12-22 ENCOUNTER — Ambulatory Visit: Payer: Self-pay | Admitting: Internal Medicine

## 2023-12-22 ENCOUNTER — Other Ambulatory Visit: Payer: Self-pay | Admitting: Internal Medicine

## 2023-12-22 ENCOUNTER — Encounter: Payer: Self-pay | Admitting: Internal Medicine

## 2023-12-22 VITALS — BP 138/82 | HR 61 | Ht 69.0 in | Wt 179.0 lb

## 2023-12-22 DIAGNOSIS — I43 Cardiomyopathy in diseases classified elsewhere: Secondary | ICD-10-CM

## 2023-12-22 DIAGNOSIS — E039 Hypothyroidism, unspecified: Secondary | ICD-10-CM | POA: Insufficient documentation

## 2023-12-22 DIAGNOSIS — I110 Hypertensive heart disease with heart failure: Secondary | ICD-10-CM | POA: Diagnosis not present

## 2023-12-22 DIAGNOSIS — E119 Type 2 diabetes mellitus without complications: Secondary | ICD-10-CM | POA: Insufficient documentation

## 2023-12-22 DIAGNOSIS — E559 Vitamin D deficiency, unspecified: Secondary | ICD-10-CM | POA: Insufficient documentation

## 2023-12-22 DIAGNOSIS — E1169 Type 2 diabetes mellitus with other specified complication: Secondary | ICD-10-CM | POA: Insufficient documentation

## 2023-12-22 DIAGNOSIS — Z125 Encounter for screening for malignant neoplasm of prostate: Secondary | ICD-10-CM

## 2023-12-22 DIAGNOSIS — E782 Mixed hyperlipidemia: Secondary | ICD-10-CM

## 2023-12-22 DIAGNOSIS — Z1211 Encounter for screening for malignant neoplasm of colon: Secondary | ICD-10-CM | POA: Insufficient documentation

## 2023-12-22 DIAGNOSIS — Z794 Long term (current) use of insulin: Secondary | ICD-10-CM

## 2023-12-22 LAB — POC CREATINE & ALBUMIN,URINE
Creatinine, POC: 300 mg/dL
Microalbumin Ur, POC: 150 mg/L

## 2023-12-22 LAB — POCT CBG (FASTING - GLUCOSE)-MANUAL ENTRY: Glucose Fasting, POC: 100 mg/dL — AB (ref 70–99)

## 2023-12-22 NOTE — Progress Notes (Signed)
 Established Patient Office Visit  Subjective:  Patient ID: Jorge Gregory, male    DOB: [birth year]  Age: 68 y.o. MRN: [id number]  Chief Complaint  Patient presents with   Establish Care    NPE    Patient is seen today to establish care for a new PCP. He reports he feels well. He recently underwent CABG and states he has improved energy levels, improved memory, and improved exercise tolerance. Patient states his skin still feels numb on left side of his chest and left leg where they harvested veins for CABG and wanted to know if that was normal or not. He reports taking his medications as prescribed without side effects. He is due for DM eye exam and his colon cancer screening with cologaurd. Will order Cologaurd for patient to complete. Patient states he has had his gallbladder removed, and 3 cervical vertebrae replacements, the last being earlier last year.  He endorses sinus congestion but denies fever, chills, sore throat, cough. Encouraged to start Flonase nasal spray to improve sinus congestion.   Patient denies any headache, chest pain, shortness of breath, nausea, vomiting, diarrhea/constipation.    No other concerns at this time.   Past Medical History:  Diagnosis Date   Diabetes mellitus without complication (HCC)    Hep C w/ coma, chronic     Past Surgical History:  Procedure Laterality Date   CHOLECYSTECTOMY     LEFT HEART CATH AND CORONARY ANGIOGRAPHY Left 08/06/2023   Procedure: LEFT HEART CATH AND CORONARY ANGIOGRAPHY with possible coronary intervention;  Surgeon: Fernand Denyse DELENA, MD;  Location: [facility] INVASIVE CV LAB;  Service: Cardiovascular;  Laterality: Left;    Social History   Socioeconomic History   Marital status: Widowed    Spouse name: Not on file   Number of children: Not on file   Years of education: Not on file   Highest education level: Not on file  Occupational History   Not on file  Tobacco Use   Smoking status: Never   Smokeless tobacco:  Never  Substance and Sexual Activity   Alcohol use: No   Drug use: Not on file   Sexual activity: Not on file  Other Topics Concern   Not on file  Social History Narrative   Not on file   Social Drivers of Health   Financial Resource Strain: Low Risk  (09/09/2023)   Received from [facility]   Overall Financial Resource Strain (CARDIA)    Difficulty of Paying Living Expenses: Not very hard  Food Insecurity: No Food Insecurity (09/09/2023)   Received from [facility]   Hunger Vital Sign    Within the past 12 months, you worried that your food would run out before you got the money to buy more.: Never true    Within the past 12 months, the food you bought just didn't last and you didn't have money to get more.: Never true  Transportation Needs: No Transportation Needs (09/09/2023)   Received from [facility] - Transportation    In the past 12 months, has lack of transportation kept you from medical appointments or from getting medications?: No    Lack of Transportation (Non-Medical): No  Physical Activity: Not on file  Stress: Not on file  Social Connections: Not on file  Intimate Partner Violence: Not on file    No family history on file.  Allergies  Allergen Reactions   Fluoxetine Hcl  Prozac - causes Headache    Percocet [Oxycodone -Acetaminophen ] Other (See Comments)    hallucinations   Prednisone     Dizziness, BP  and BS elevated    Outpatient Medications Prior to Visit  Medication Sig   aspirin  EC 81 MG tablet Take 1 tablet (81 mg total) by mouth daily. Swallow whole.   cetirizine (ZYRTEC) 10 MG tablet Take 10 mg by mouth daily.   ergocalciferol (VITAMIN D2) 1.25 MG (50000 UT) capsule Take 50,000 Units by mouth once a week. (Patient taking differently: Take 50,000 Units by mouth as needed.)   insulin glargine , 2 Unit Dial, (TOUJEO  MAX SOLOSTAR) 300 UNIT/ML Solostar Pen Inject 25 Units into the skin in  the morning and at bedtime.   losartan  (COZAAR ) 50 MG tablet Take 1 tablet (50 mg total) by mouth 2 (two) times daily.   magnesium 30 MG tablet Take 30 mg by mouth once.   metoprolol  succinate (TOPROL  XL) 25 MG 24 hr tablet Take 1 tablet (25 mg total) by mouth daily.   Multiple Vitamins-Minerals (MENS MULTIVITAMIN PLUS PO) Take 1 capsule by mouth daily.   naproxen (NAPROSYN) 250 MG tablet Take 250 mg by mouth 2 (two) times daily with a meal.   nitroGLYCERIN  (NITROSTAT ) 0.4 MG SL tablet Place 1 tablet (0.4 mg total) under the tongue every 5 (five) minutes as needed for chest pain.   rosuvastatin  (CRESTOR ) 40 MG tablet TAKE ONE TABLET BY MOUTH ONE TIME DAILY   sitaGLIPtin -metformin  (JANUMET ) 50-1000 MG tablet Take 1 tablet by mouth 2 (two) times daily with a meal.   dapagliflozin  propanediol (FARXIGA ) 10 MG TABS tablet Take 1 tablet (10 mg total) by mouth daily before breakfast. (Patient not taking: Reported on 12/22/2023)   metoprolol  tartrate (LOPRESSOR ) 25 MG tablet Take 25 mg by mouth. (Patient not taking: Reported on 12/22/2023)   No facility-administered medications prior to visit.    Review of Systems  Constitutional: Negative.   HENT:  Positive for congestion.   Eyes: Negative.   Respiratory: Negative.  Negative for cough and shortness of breath.   Cardiovascular: Negative.  Negative for chest pain, palpitations and leg swelling.  Gastrointestinal: Negative.  Negative for abdominal pain, constipation, diarrhea, heartburn, nausea and vomiting.  Genitourinary: Negative.  Negative for dysuria and flank pain.  Musculoskeletal: Negative.  Negative for joint pain and myalgias.  Skin: Negative.   Neurological:  Positive for sensory change (Left chest wall numbness post CABG). Negative for dizziness and headaches.  Endo/Heme/Allergies: Negative.   Psychiatric/Behavioral: Negative.  Negative for depression and suicidal ideas. The patient is not nervous/anxious.        Objective:   BP  138/82   Pulse 61   Ht 5' 9 (1.753 m)   Wt 179 lb (81.2 kg)   SpO2 98%   BMI 26.43 kg/m   Vitals:   12/22/23 0947  BP: 138/82  Pulse: 61  Height: 5' 9 (1.753 m)  Weight: 179 lb (81.2 kg)  SpO2: 98%  BMI (Calculated): 26.42    Physical Exam Vitals and nursing note reviewed.  Constitutional:      Appearance: Normal appearance.  HENT:     Head: Normocephalic and atraumatic.     Nose: Nose normal.     Mouth/Throat:     Mouth: Mucous membranes are moist.     Pharynx: Oropharynx is clear.  Eyes:     Conjunctiva/sclera: Conjunctivae normal.     Pupils: Pupils are equal, round, and reactive to light.  Cardiovascular:  Rate and Rhythm: Normal rate and regular rhythm.     Pulses: Normal pulses.     Heart sounds: Normal heart sounds.  Pulmonary:     Effort: Pulmonary effort is normal.     Breath sounds: Normal breath sounds.  Abdominal:     General: Bowel sounds are normal.     Palpations: Abdomen is soft.  Musculoskeletal:        General: Normal range of motion.     Cervical back: Normal range of motion.  Skin:    General: Skin is warm and dry.  Neurological:     General: No focal deficit present.     Mental Status: He is alert and oriented to person, place, and time.  Psychiatric:        Mood and Affect: Mood normal.        Behavior: Behavior normal.        Judgment: Judgment normal.      Results for orders placed or performed in visit on 12/22/23  POCT CBG (Fasting - Glucose)  Result Value Ref Range   Glucose Fasting, POC 100 (A) 70 - 99 mg/dL  POC CREATINE & ALBUMIN,URINE  Result Value Ref Range   Microalbumin Ur, POC 150 mg/L   Creatinine, POC 300 mg/dL   Albumin/Creatinine Ratio, Urine, POC 30-300     Recent Results (from the past 2160 hours)  CBC with Differential/Platelet     Status: Abnormal   Collection Time: 09/24/23 11:28 AM  Result Value Ref Range   WBC 13.2 (H) 3.4 - 10.8 x10E3/uL   RBC 4.44 4.14 - 5.80 x10E6/uL   Hemoglobin 13.1 13.0  - 17.7 g/dL   Hematocrit 59.7 62.4 - 51.0 %   MCV 91 79 - 97 fL   MCH 29.5 26.6 - 33.0 pg   MCHC 32.6 31.5 - 35.7 g/dL   RDW 86.9 88.3 - 84.5 %   Platelets 421 150 - 450 x10E3/uL   Neutrophils 66 Not Estab. %   Lymphs 18 Not Estab. %   Monocytes 7 Not Estab. %   Eos 8 Not Estab. %   Basos 1 Not Estab. %   Neutrophils Absolute 8.7 (H) 1.4 - 7.0 x10E3/uL   Lymphocytes Absolute 2.4 0.7 - 3.1 x10E3/uL   Monocytes Absolute 0.9 0.1 - 0.9 x10E3/uL   EOS (ABSOLUTE) 1.0 (H) 0.0 - 0.4 x10E3/uL   Basophils Absolute 0.1 0.0 - 0.2 x10E3/uL   Immature Granulocytes 0 Not Estab. %   Immature Grans (Abs) 0.0 0.0 - 0.1 x10E3/uL  Comprehensive metabolic panel     Status: Abnormal   Collection Time: 09/24/23 11:29 AM  Result Value Ref Range   Glucose 131 (H) 70 - 99 mg/dL   BUN 19 8 - 27 mg/dL   Creatinine, Ser 9.03 0.76 - 1.27 mg/dL   eGFR 87 >40 fO/fpw/8.26   BUN/Creatinine Ratio 20 10 - 24   Sodium 138 134 - 144 mmol/L   Potassium 4.8 3.5 - 5.2 mmol/L   Chloride 102 96 - 106 mmol/L   CO2 18 (L) 20 - 29 mmol/L   Calcium  10.0 8.6 - 10.2 mg/dL   Total Protein 7.9 6.0 - 8.5 g/dL   Albumin 4.4 3.9 - 4.9 g/dL   Globulin, Total 3.5 1.5 - 4.5 g/dL   Bilirubin Total 0.4 0.0 - 1.2 mg/dL   Alkaline Phosphatase 121 44 - 121 IU/L   AST 20 0 - 40 IU/L   ALT 34 0 - 44 IU/L  Glucose, capillary  Status: Abnormal   Collection Time: 10/26/23  7:24 AM  Result Value Ref Range   Glucose-Capillary 140 (H) 70 - 99 mg/dL    Comment: Glucose reference range applies only to samples taken after fasting for at least 8 hours.  Glucose, capillary     Status: Abnormal   Collection Time: 10/26/23  8:30 AM  Result Value Ref Range   Glucose-Capillary 106 (H) 70 - 99 mg/dL    Comment: Glucose reference range applies only to samples taken after fasting for at least 8 hours.  POCT CBG (Fasting - Glucose)     Status: Abnormal   Collection Time: 12/22/23  9:52 AM  Result Value Ref Range   Glucose Fasting, POC 100 (A)  70 - 99 mg/dL  POC CREATINE & ALBUMIN,URINE     Status: Abnormal   Collection Time: 12/22/23 10:56 AM  Result Value Ref Range   Microalbumin Ur, POC 150 mg/L   Creatinine, POC 300 mg/dL   Albumin/Creatinine Ratio, Urine, POC 30-300       Assessment & Plan:  Continue medications as prescribed. Will collect routine labs and follow up with results. Cologuard ordered.  Problem List Items Addressed This Visit     Cardiomyopathy due to hypertension, with heart failure (HCC)   Relevant Orders   CBC with Diff   CMP14+EGFR   Vitamin D deficiency   Relevant Orders   Vitamin D (25 hydroxy)   Colon cancer screening   Relevant Orders   Cologuard   Hypothyroidism   Relevant Orders   TSH+T4F+T3Free   Combined hyperlipidemia associated with type 2 diabetes mellitus (HCC)   Relevant Orders   Lipid Panel w/o Chol/HDL Ratio   Type 2 diabetes mellitus without complication, with long-term current use of insulin (HCC) - Primary   Relevant Orders   POCT CBG (Fasting - Glucose) (Completed)   Hemoglobin A1c   POC CREATINE & ALBUMIN,URINE (Completed)   Other Visit Diagnoses       Prostate cancer screening           Return in about 3 months (around 03/22/2024).   Total time spent: 30 minutes  FERNAND FREDY RAMAN, MD  12/22/2023   This document may have been prepared by [facility] Voice Recognition software and as such may include unintentional dictation errors.

## 2023-12-23 ENCOUNTER — Encounter

## 2023-12-23 LAB — MICROALBUMIN / CREATININE URINE RATIO
Creatinine, Urine: 244 mg/dL
Microalb/Creat Ratio: 97 mg/g{creat} — ABNORMAL HIGH (ref 0–29)
Microalbumin, Urine: 237.5 ug/mL

## 2023-12-23 LAB — CBC WITH DIFFERENTIAL/PLATELET
Basophils Absolute: 0.1 x10E3/uL (ref 0.0–0.2)
Basos: 1 %
EOS (ABSOLUTE): 0.2 x10E3/uL (ref 0.0–0.4)
Eos: 3 %
Hematocrit: 41.6 % (ref 37.5–51.0)
Hemoglobin: 13.4 g/dL (ref 13.0–17.7)
Immature Grans (Abs): 0 x10E3/uL (ref 0.0–0.1)
Immature Granulocytes: 0 %
Lymphocytes Absolute: 1.8 x10E3/uL (ref 0.7–3.1)
Lymphs: 30 %
MCH: 29.2 pg (ref 26.6–33.0)
MCHC: 32.2 g/dL (ref 31.5–35.7)
MCV: 91 fL (ref 79–97)
Monocytes Absolute: 0.5 x10E3/uL (ref 0.1–0.9)
Monocytes: 9 %
Neutrophils Absolute: 3.4 x10E3/uL (ref 1.4–7.0)
Neutrophils: 57 %
Platelets: 178 x10E3/uL (ref 150–450)
RBC: 4.59 x10E6/uL (ref 4.14–5.80)
RDW: 14.9 % (ref 11.6–15.4)
WBC: 6 x10E3/uL (ref 3.4–10.8)

## 2023-12-23 LAB — CMP14+EGFR
ALT: 23 IU/L (ref 0–44)
AST: 27 IU/L (ref 0–40)
Albumin: 4.7 g/dL (ref 3.9–4.9)
Alkaline Phosphatase: 57 IU/L (ref 44–121)
BUN/Creatinine Ratio: 24 (ref 10–24)
BUN: 22 mg/dL (ref 8–27)
Bilirubin Total: 0.5 mg/dL (ref 0.0–1.2)
CO2: 20 mmol/L (ref 20–29)
Calcium: 9.5 mg/dL (ref 8.6–10.2)
Chloride: 107 mmol/L — ABNORMAL HIGH (ref 96–106)
Creatinine, Ser: 0.93 mg/dL (ref 0.76–1.27)
Globulin, Total: 2.8 g/dL (ref 1.5–4.5)
Glucose: 91 mg/dL (ref 70–99)
Potassium: 5.2 mmol/L (ref 3.5–5.2)
Sodium: 138 mmol/L (ref 134–144)
Total Protein: 7.5 g/dL (ref 6.0–8.5)
eGFR: 91 mL/min/1.73 (ref >59)

## 2023-12-23 LAB — TSH+T4F+T3FREE
Free T4: 1 ng/dL (ref 0.82–1.77)
T3, Free: 3.5 pg/mL (ref 2.0–4.4)
TSH: 2.69 u[IU]/mL (ref 0.450–4.500)

## 2023-12-23 LAB — LIPID PANEL W/O CHOL/HDL RATIO
Cholesterol, Total: 110 mg/dL (ref 100–199)
HDL: 42 mg/dL (ref >39)
LDL Chol Calc (NIH): 47 mg/dL (ref 0–99)
Triglycerides: 114 mg/dL (ref 0–149)
VLDL Cholesterol Cal: 21 mg/dL (ref 5–40)

## 2023-12-23 LAB — HEMOGLOBIN A1C
Est. average glucose Bld gHb Est-mCnc: 140 mg/dL
Hgb A1c MFr Bld: 6.5 % — ABNORMAL HIGH (ref 4.8–5.6)

## 2023-12-24 ENCOUNTER — Ambulatory Visit: Payer: Self-pay | Admitting: Internal Medicine

## 2023-12-25 ENCOUNTER — Encounter

## 2023-12-28 ENCOUNTER — Encounter: Payer: Self-pay | Admitting: Cardiovascular Disease

## 2023-12-28 ENCOUNTER — Encounter

## 2023-12-29 ENCOUNTER — Telehealth: Payer: Self-pay

## 2023-12-29 NOTE — Telephone Encounter (Signed)
 Pt's daughter called to see if there is an alternative for pt's metoprolol  that isn't a beta blocker because they have heard bad things about them. Pt is starting to refuse to take it. Please advise.

## 2023-12-30 ENCOUNTER — Encounter

## 2024-01-01 ENCOUNTER — Encounter

## 2024-01-04 ENCOUNTER — Ambulatory Visit (INDEPENDENT_AMBULATORY_CARE_PROVIDER_SITE_OTHER): Admitting: Internal Medicine

## 2024-01-04 ENCOUNTER — Encounter

## 2024-01-04 ENCOUNTER — Encounter: Payer: Self-pay | Admitting: Internal Medicine

## 2024-01-04 ENCOUNTER — Encounter: Payer: Self-pay | Admitting: Cardiovascular Disease

## 2024-01-04 ENCOUNTER — Ambulatory Visit (INDEPENDENT_AMBULATORY_CARE_PROVIDER_SITE_OTHER): Admitting: Cardiovascular Disease

## 2024-01-04 VITALS — BP 126/74 | HR 64 | Ht 69.0 in | Wt 180.0 lb

## 2024-01-04 VITALS — BP 150/84 | HR 63 | Ht 69.0 in | Wt 180.6 lb

## 2024-01-04 DIAGNOSIS — W57XXXA Bitten or stung by nonvenomous insect and other nonvenomous arthropods, initial encounter: Secondary | ICD-10-CM | POA: Insufficient documentation

## 2024-01-04 DIAGNOSIS — E782 Mixed hyperlipidemia: Secondary | ICD-10-CM

## 2024-01-04 DIAGNOSIS — Z794 Long term (current) use of insulin: Secondary | ICD-10-CM

## 2024-01-04 DIAGNOSIS — I5022 Chronic systolic (congestive) heart failure: Secondary | ICD-10-CM | POA: Diagnosis not present

## 2024-01-04 DIAGNOSIS — I152 Hypertension secondary to endocrine disorders: Secondary | ICD-10-CM | POA: Diagnosis not present

## 2024-01-04 DIAGNOSIS — I2510 Atherosclerotic heart disease of native coronary artery without angina pectoris: Secondary | ICD-10-CM | POA: Diagnosis not present

## 2024-01-04 DIAGNOSIS — E119 Type 2 diabetes mellitus without complications: Secondary | ICD-10-CM

## 2024-01-04 DIAGNOSIS — I110 Hypertensive heart disease with heart failure: Secondary | ICD-10-CM

## 2024-01-04 DIAGNOSIS — E1159 Type 2 diabetes mellitus with other circulatory complications: Secondary | ICD-10-CM | POA: Diagnosis not present

## 2024-01-04 DIAGNOSIS — E1169 Type 2 diabetes mellitus with other specified complication: Secondary | ICD-10-CM | POA: Diagnosis not present

## 2024-01-04 DIAGNOSIS — B888 Other specified infestations: Secondary | ICD-10-CM | POA: Insufficient documentation

## 2024-01-04 NOTE — Progress Notes (Signed)
 Cardiology Office Note   Date:  01/04/2024   ID:  Jorge Gregory, DOB [birth year], MRN [id number]  PCP:  Christi Vannie PARAS, MD  Cardiologist:  Denyse Bathe, MD      History of Present Illness: Jorge Gregory is a 68 y.o. male who presents for  Chief Complaint  Patient presents with   Acute Visit    Discuss Medication     Has bed bugs.      Past Medical History:  Diagnosis Date   Diabetes mellitus without complication (HCC)    Hep C w/ coma, chronic      Past Surgical History:  Procedure Laterality Date   CHOLECYSTECTOMY     LEFT HEART CATH AND CORONARY ANGIOGRAPHY Left 08/06/2023   Procedure: LEFT HEART CATH AND CORONARY ANGIOGRAPHY with possible coronary intervention;  Surgeon: Bathe Denyse DELENA, MD;  Location: [facility] INVASIVE CV LAB;  Service: Cardiovascular;  Laterality: Left;     Current Outpatient Medications  Medication Sig Dispense Refill   aspirin  EC 81 MG tablet Take 1 tablet (81 mg total) by mouth daily. Swallow whole. 30 tablet 12   cetirizine (ZYRTEC) 10 MG tablet Take 10 mg by mouth daily.     dapagliflozin  propanediol (FARXIGA ) 10 MG TABS tablet Take 1 tablet (10 mg total) by mouth daily before breakfast. 30 tablet 2   insulin glargine , 2 Unit Dial, (TOUJEO  MAX SOLOSTAR) 300 UNIT/ML Solostar Pen Inject 25 Units into the skin in the morning and at bedtime. 15 mL 0   losartan  (COZAAR ) 50 MG tablet Take 1 tablet (50 mg total) by mouth 2 (two) times daily. 60 tablet 11   magnesium 30 MG tablet Take 30 mg by mouth once.     metoprolol  succinate (TOPROL  XL) 25 MG 24 hr tablet Take 1 tablet (25 mg total) by mouth daily. 30 tablet 3   Multiple Vitamins-Minerals (MENS MULTIVITAMIN PLUS PO) Take 1 capsule by mouth daily.     naproxen (NAPROSYN) 250 MG tablet Take 250 mg by mouth 2 (two) times daily with a meal.     nitroGLYCERIN  (NITROSTAT ) 0.4 MG SL tablet Place 1 tablet (0.4 mg total) under the tongue every 5 (five) minutes as needed for chest pain. 100 tablet 3    rosuvastatin  (CRESTOR ) 40 MG tablet TAKE ONE TABLET BY MOUTH ONE TIME DAILY 30 tablet 2   sitaGLIPtin -metformin  (JANUMET ) 50-1000 MG tablet Take 1 tablet by mouth 2 (two) times daily with a meal. 180 tablet 0   No current facility-administered medications for this visit.    Allergies:   Fluoxetine hcl, Percocet [oxycodone -acetaminophen ], and Prednisone    Social History:   reports that he has never smoked. He has never used smokeless tobacco. He reports that he does not drink alcohol. No history on file for drug use.   Family History:  family history is not on file.    ROS:     Review of Systems  Constitutional: Negative.   HENT: Negative.    Eyes: Negative.   Respiratory: Negative.    Gastrointestinal: Negative.   Genitourinary: Negative.   Musculoskeletal: Negative.   Skin: Negative.   Neurological: Negative.   Endo/Heme/Allergies: Negative.   Psychiatric/Behavioral: Negative.    All other systems reviewed and are negative.     All other systems are reviewed and negative.    PHYSICAL EXAM: VS:  BP 126/74   Pulse 64   Ht 5' 9 (1.753 m)   Wt 180 lb (81.6 kg)   SpO2  98%   BMI 26.58 kg/m  , BMI Body mass index is 26.58 kg/m. Last weight:  Wt Readings from Last 3 Encounters:  01/04/24 180 lb (81.6 kg)  12/22/23 179 lb (81.2 kg)  10/26/23 175 lb (79.4 kg)     Physical Exam Vitals reviewed.  Constitutional:      Appearance: Normal appearance. He is normal weight.  HENT:     Head: Normocephalic.     Nose: Nose normal.     Mouth/Throat:     Mouth: Mucous membranes are moist.  Eyes:     Pupils: Pupils are equal, round, and reactive to light.  Cardiovascular:     Rate and Rhythm: Normal rate and regular rhythm.     Pulses: Normal pulses.     Heart sounds: Normal heart sounds.  Pulmonary:     Effort: Pulmonary effort is normal.  Abdominal:     General: Abdomen is flat. Bowel sounds are normal.  Musculoskeletal:        General: Normal range of motion.      Cervical back: Normal range of motion.  Skin:    General: Skin is warm.  Neurological:     General: No focal deficit present.     Mental Status: He is alert.  Psychiatric:        Mood and Affect: Mood normal.       EKG:   Recent Labs: 12/22/2023: ALT 23; BUN 22; Creatinine, Ser 0.93; Hemoglobin 13.4; Platelets 178; Potassium 5.2; Sodium 138; TSH 2.690    Lipid Panel    Component Value Date/Time   CHOL 110 12/22/2023 1028   TRIG 114 12/22/2023 1028   HDL 42 12/22/2023 1028   CHOLHDL 4.2 CALC 02/18/2007 1024   VLDL 17 02/18/2007 1024   LDLCALC 47 12/22/2023 1028      Other studies Reviewed: Additional studies/ records that were reviewed today include:  Review of the above records demonstrates:       No data to display            ASSESSMENT AND PLAN:    ICD-10-CM   1. Cardiomyopathy due to hypertension, with heart failure (HCC)  I11.0    I43     2. Type 2 diabetes mellitus without complication, with long-term current use of insulin (HCC)  E11.9    Z79.4     3. Combined hyperlipidemia associated with type 2 diabetes mellitus (HCC)  E11.69    E78.2     4. Coronary artery disease involving native coronary artery of native heart without angina pectoris  I25.10     5. CHF (congestive heart failure), NYHA class I, chronic, systolic (HCC)  I50.22    LVEF 45=50%, Needs Metoprolol  as Low EF.       Problem List Items Addressed This Visit       Cardiovascular and Mediastinum   Cardiomyopathy due to hypertension, with heart failure (HCC) - Primary   Coronary artery disease involving native coronary artery of native heart without angina pectoris   CHF (congestive heart failure), NYHA class I, chronic, systolic (HCC)     Endocrine   Combined hyperlipidemia associated with type 2 diabetes mellitus (HCC)   Type 2 diabetes mellitus without complication, with long-term current use of insulin (HCC)       Disposition:   Return in about 3 months (around  04/04/2024).    Total time spent: 30 minutes  Signed,  Denyse Bathe, MD  01/04/2024 11:11 AM    [facility]

## 2024-01-04 NOTE — Telephone Encounter (Signed)
 Patient daughter informed, told them to call and schedule before 10/10 which is patient next visit with [facility]

## 2024-01-04 NOTE — Telephone Encounter (Signed)
 Patient daughter informed - ao

## 2024-01-04 NOTE — Progress Notes (Signed)
 Established Patient Office Visit  Subjective:  Patient ID: Jorge Gregory, male    DOB: [birth year]  Age: 68 y.o. MRN: [id number]  Chief Complaint  Patient presents with   Insect Bite    Bed bug bites    Patient is here today due to bed bug infestation. He reports he stayed at an Airbnb , 5 days ago and there was bed bugs. Endorses erythremic clustered papular rash on BUE, trunk, back, and left thigh that is itchy. Patient takes Zyretc once daily and has been using topical cortisone cream, topical benadryl cream, and topical calamine lotion to reduce itching. Patient reports rash is improving. At this time there is no sign of secondary infection due to scratching. Encouraged patient to continue OTC treatment regimen and FU in 1 week if there is no significant improvement.     No other concerns at this time.   Past Medical History:  Diagnosis Date   Diabetes mellitus without complication (HCC)    Hep C w/ coma, chronic     Past Surgical History:  Procedure Laterality Date   CHOLECYSTECTOMY     LEFT HEART CATH AND CORONARY ANGIOGRAPHY Left 08/06/2023   Procedure: LEFT HEART CATH AND CORONARY ANGIOGRAPHY with possible coronary intervention;  Surgeon: Fernand Denyse DELENA, MD;  Location: [facility] INVASIVE CV LAB;  Service: Cardiovascular;  Laterality: Left;    Social History   Socioeconomic History   Marital status: Widowed    Spouse name: Not on file   Number of children: Not on file   Years of education: Not on file   Highest education level: Not on file  Occupational History   Not on file  Tobacco Use   Smoking status: Never   Smokeless tobacco: Never  Substance and Sexual Activity   Alcohol use: No   Drug use: Not on file   Sexual activity: Not on file  Other Topics Concern   Not on file  Social History Narrative   Not on file   Social Drivers of Health   Financial Resource Strain: Low Risk  (09/09/2023)   Received from [facility]   Overall Financial  Resource Strain (CARDIA)    Difficulty of Paying Living Expenses: Not very hard  Food Insecurity: No Food Insecurity (09/09/2023)   Received from [facility]   Hunger Vital Sign    Within the past 12 months, you worried that your food would run out before you got the money to buy more.: Never true    Within the past 12 months, the food you bought just didn't last and you didn't have money to get more.: Never true  Transportation Needs: No Transportation Needs (09/09/2023)   Received from [facility] - Transportation    In the past 12 months, has lack of transportation kept you from medical appointments or from getting medications?: No    Lack of Transportation (Non-Medical): No  Physical Activity: Not on file  Stress: Not on file  Social Connections: Not on file  Intimate Partner Violence: Not on file    No family history on file.  Allergies  Allergen Reactions   Fluoxetine Hcl     Prozac - causes Headache    Percocet [Oxycodone -Acetaminophen ] Other (See Comments)    hallucinations   Prednisone     Dizziness, BP  and BS elevated    Outpatient Medications Prior to Visit  Medication Sig   aspirin  EC 81 MG tablet Take 1 tablet (  81 mg total) by mouth daily. Swallow whole.   cetirizine (ZYRTEC) 10 MG tablet Take 10 mg by mouth daily.   dapagliflozin  propanediol (FARXIGA ) 10 MG TABS tablet Take 1 tablet (10 mg total) by mouth daily before breakfast.   insulin glargine , 2 Unit Dial, (TOUJEO  MAX SOLOSTAR) 300 UNIT/ML Solostar Pen Inject 25 Units into the skin in the morning and at bedtime.   losartan  (COZAAR ) 50 MG tablet Take 1 tablet (50 mg total) by mouth 2 (two) times daily.   magnesium 30 MG tablet Take 30 mg by mouth once.   metoprolol  succinate (TOPROL  XL) 25 MG 24 hr tablet Take 1 tablet (25 mg total) by mouth daily.   Multiple Vitamins-Minerals (MENS MULTIVITAMIN PLUS PO) Take 1 capsule by mouth daily.   naproxen (NAPROSYN) 250 MG  tablet Take 250 mg by mouth 2 (two) times daily with a meal.   nitroGLYCERIN  (NITROSTAT ) 0.4 MG SL tablet Place 1 tablet (0.4 mg total) under the tongue every 5 (five) minutes as needed for chest pain.   rosuvastatin  (CRESTOR ) 40 MG tablet TAKE ONE TABLET BY MOUTH ONE TIME DAILY   sitaGLIPtin -metformin  (JANUMET ) 50-1000 MG tablet Take 1 tablet by mouth 2 (two) times daily with a meal.   No facility-administered medications prior to visit.    Review of Systems  Constitutional: Negative.  Negative for chills, fever and malaise/fatigue.  HENT: Negative.  Negative for congestion and sore throat.   Eyes: Negative.  Negative for blurred vision and pain.  Respiratory: Negative.  Negative for cough and shortness of breath.   Cardiovascular: Negative.  Negative for chest pain, palpitations and leg swelling.  Gastrointestinal: Negative.  Negative for abdominal pain, blood in stool, constipation, diarrhea, heartburn, melena, nausea and vomiting.  Genitourinary: Negative.  Negative for dysuria, flank pain, frequency and urgency.  Musculoskeletal: Negative.  Negative for joint pain and myalgias.  Skin:  Positive for itching and rash.       Erythema, clustered papules  Neurological: Negative.  Negative for dizziness, tingling, sensory change, weakness and headaches.  Endo/Heme/Allergies: Negative.   Psychiatric/Behavioral: Negative.  Negative for depression and suicidal ideas. The patient is not nervous/anxious.        Objective:   BP (!) 150/84   Pulse 63   Ht 5' 9 (1.753 m)   Wt 180 lb 9.6 oz (81.9 kg)   SpO2 97%   BMI 26.67 kg/m   Vitals:   01/04/24 1124  BP: (!) 150/84  Pulse: 63  Height: 5' 9 (1.753 m)  Weight: 180 lb 9.6 oz (81.9 kg)  SpO2: 97%  BMI (Calculated): 26.66    Physical Exam Vitals and nursing note reviewed.  Constitutional:      General: He is not in acute distress.    Appearance: Normal appearance. He is not ill-appearing.  HENT:     Head: Normocephalic and  atraumatic.     Nose: Nose normal.     Mouth/Throat:     Mouth: Mucous membranes are moist.     Pharynx: Oropharynx is clear.  Eyes:     Conjunctiva/sclera: Conjunctivae normal.     Pupils: Pupils are equal, round, and reactive to light.  Cardiovascular:     Rate and Rhythm: Normal rate and regular rhythm.     Pulses: Normal pulses.     Heart sounds: Normal heart sounds.  Pulmonary:     Effort: Pulmonary effort is normal.     Breath sounds: Normal breath sounds. No wheezing or rhonchi.  Abdominal:  General: Bowel sounds are normal. There is no distension.     Palpations: Abdomen is soft.     Tenderness: There is no abdominal tenderness.  Musculoskeletal:        General: Normal range of motion.     Cervical back: Normal range of motion and neck supple.     Right lower leg: No edema.     Left lower leg: No edema.  Skin:    General: Skin is warm and dry.     Capillary Refill: Capillary refill takes less than 2 seconds.     Findings: Rash present. Rash is papular.      Neurological:     General: No focal deficit present.     Mental Status: He is alert and oriented to person, place, and time.     Sensory: No sensory deficit.     Motor: No weakness.  Psychiatric:        Mood and Affect: Mood normal.        Behavior: Behavior normal.        Judgment: Judgment normal.      No results found for any visits on 01/04/24.  Recent Results (from the past 2160 hours)  Glucose, capillary     Status: Abnormal   Collection Time: 10/26/23  7:24 AM  Result Value Ref Range   Glucose-Capillary 140 (H) 70 - 99 mg/dL    Comment: Glucose reference range applies only to samples taken after fasting for at least 8 hours.  Glucose, capillary     Status: Abnormal   Collection Time: 10/26/23  8:30 AM  Result Value Ref Range   Glucose-Capillary 106 (H) 70 - 99 mg/dL    Comment: Glucose reference range applies only to samples taken after fasting for at least 8 hours.  POCT CBG (Fasting -  Glucose)     Status: Abnormal   Collection Time: 12/22/23  9:52 AM  Result Value Ref Range   Glucose Fasting, POC 100 (A) 70 - 99 mg/dL  CBC with Diff     Status: None   Collection Time: 12/22/23 10:28 AM  Result Value Ref Range   WBC 6.0 3.4 - 10.8 x10E3/uL   RBC 4.59 4.14 - 5.80 x10E6/uL   Hemoglobin 13.4 13.0 - 17.7 g/dL   Hematocrit 58.3 62.4 - 51.0 %   MCV 91 79 - 97 fL   MCH 29.2 26.6 - 33.0 pg   MCHC 32.2 31.5 - 35.7 g/dL   RDW 85.0 88.3 - 84.5 %   Platelets 178 150 - 450 x10E3/uL   Neutrophils 57 Not Estab. %   Lymphs 30 Not Estab. %   Monocytes 9 Not Estab. %   Eos 3 Not Estab. %   Basos 1 Not Estab. %   Neutrophils Absolute 3.4 1.4 - 7.0 x10E3/uL   Lymphocytes Absolute 1.8 0.7 - 3.1 x10E3/uL   Monocytes Absolute 0.5 0.1 - 0.9 x10E3/uL   EOS (ABSOLUTE) 0.2 0.0 - 0.4 x10E3/uL   Basophils Absolute 0.1 0.0 - 0.2 x10E3/uL   Immature Granulocytes 0 Not Estab. %   Immature Grans (Abs) 0.0 0.0 - 0.1 x10E3/uL  CMP14+EGFR     Status: Abnormal   Collection Time: 12/22/23 10:28 AM  Result Value Ref Range   Glucose 91 70 - 99 mg/dL   BUN 22 8 - 27 mg/dL   Creatinine, Ser 9.06 0.76 - 1.27 mg/dL   eGFR 91 >40 fO/fpw/8.26   BUN/Creatinine Ratio 24 10 - 24   Sodium 138 134 -  144 mmol/L   Potassium 5.2 3.5 - 5.2 mmol/L   Chloride 107 (H) 96 - 106 mmol/L   CO2 20 20 - 29 mmol/L   Calcium  9.5 8.6 - 10.2 mg/dL   Total Protein 7.5 6.0 - 8.5 g/dL   Albumin 4.7 3.9 - 4.9 g/dL   Globulin, Total 2.8 1.5 - 4.5 g/dL   Bilirubin Total 0.5 0.0 - 1.2 mg/dL   Alkaline Phosphatase 57 44 - 121 IU/L    Comment: **Effective January 04, 2024 Alkaline Phosphatase**   reference interval will be changing to:              Age                Male          Male           0 -  5 days         47 - 127       47 - 127           6 - 10 days         29 - 242       29 - 242          11 - 20 days        109 - 357      109 - 357          21 - 30 days         94 - 494       94 - 494           1 -  2  months      149 - 539      149 - 539           3 -  6 months      131 - 452      131 - 452           7 - 11 months      117 - 401      117 - 401   12 months -  6 years       158 - 369      158 - 369           7 - 12 years       150 - 409      150 - 409               13 years       156 - 435       78 - 227               14 years       114 - 375       64 - 161               15 years        88 - 279       56 - 134               16 years        74 - 207       51 - 121               17 years        63 - 161       47 - 113  18 - 20 years        51 - 125       42 - 106          21 - 50 years         47 - 123       41 - 116          51 - 80 years        49 - 135       51 - 125              >80 years        48 - 129       48 - 129    AST 27 0 - 40 IU/L   ALT 23 0 - 44 IU/L  Lipid Panel w/o Chol/HDL Ratio     Status: None   Collection Time: 12/22/23 10:28 AM  Result Value Ref Range   Cholesterol, Total 110 100 - 199 mg/dL   Triglycerides 885 0 - 149 mg/dL   HDL 42 >60 mg/dL   VLDL Cholesterol Cal 21 5 - 40 mg/dL   LDL Chol Calc (NIH) 47 0 - 99 mg/dL  Hemoglobin J8r     Status: Abnormal   Collection Time: 12/22/23 10:28 AM  Result Value Ref Range   Hgb A1c MFr Bld 6.5 (H) 4.8 - 5.6 %    Comment:          Prediabetes: 5.7 - 6.4          Diabetes: >6.4          Glycemic control for adults with diabetes: <7.0    Est. average glucose Bld gHb Est-mCnc 140 mg/dL  [id number]     Status: None   Collection Time: 12/22/23 10:28 AM  Result Value Ref Range   TSH 2.690 0.450 - 4.500 uIU/mL   T3, Free 3.5 2.0 - 4.4 pg/mL   Free T4 1.00 0.82 - 1.77 ng/dL  POC CREATINE & ALBUMIN,URINE     Status: Abnormal   Collection Time: 12/22/23 10:56 AM  Result Value Ref Range   Microalbumin Ur, POC 150 mg/L   Creatinine, POC 300 mg/dL   Albumin/Creatinine Ratio, Urine, POC 30-300   Microalbumin / creatinine urine ratio     Status: Abnormal   Collection Time: 12/22/23  3:11 PM  Result Value Ref  Range   Creatinine, Urine 244.0 Not Estab. mg/dL   Microalbumin, Urine 762.4 Not Estab. ug/mL   Microalb/Creat Ratio 97 (H) 0 - 29 mg/g creat    Comment:                        Normal:                0 -  29                        Moderately increased: 30 - 300                        Severely increased:       >300       Assessment & Plan:  Continue taking medications as prescribed. Continue OTC topical creams to rash. FU in 1 week if symptoms rash don't improve or secondary signs of infection develop Problem List Items Addressed This Visit     Hypertension associated with diabetes (HCC)   Combined hyperlipidemia  associated with type 2 diabetes mellitus (HCC)   Type 2 diabetes mellitus without complication, with long-term current use of insulin (HCC)   Bedbug bite - Primary    Follow up as scheduled.  Total time spent: 15 minutes  FERNAND FREDY RAMAN, MD  01/04/2024   This document may have been prepared by [facility] Voice Recognition software and as such may include unintentional dictation errors.

## 2024-01-06 ENCOUNTER — Other Ambulatory Visit: Payer: Self-pay | Admitting: Internal Medicine

## 2024-01-06 ENCOUNTER — Encounter

## 2024-01-08 ENCOUNTER — Encounter

## 2024-01-11 ENCOUNTER — Encounter

## 2024-01-13 ENCOUNTER — Encounter

## 2024-01-15 ENCOUNTER — Encounter

## 2024-01-18 ENCOUNTER — Encounter

## 2024-01-19 ENCOUNTER — Other Ambulatory Visit: Payer: Self-pay | Admitting: Cardiovascular Disease

## 2024-01-19 DIAGNOSIS — I5022 Chronic systolic (congestive) heart failure: Secondary | ICD-10-CM

## 2024-01-19 DIAGNOSIS — R0602 Shortness of breath: Secondary | ICD-10-CM

## 2024-01-19 DIAGNOSIS — I110 Hypertensive heart disease with heart failure: Secondary | ICD-10-CM

## 2024-01-19 DIAGNOSIS — I2510 Atherosclerotic heart disease of native coronary artery without angina pectoris: Secondary | ICD-10-CM

## 2024-01-19 DIAGNOSIS — E782 Mixed hyperlipidemia: Secondary | ICD-10-CM

## 2024-01-19 DIAGNOSIS — I10 Essential (primary) hypertension: Secondary | ICD-10-CM

## 2024-01-19 DIAGNOSIS — R0789 Other chest pain: Secondary | ICD-10-CM

## 2024-01-20 ENCOUNTER — Encounter

## 2024-01-29 ENCOUNTER — Ambulatory Visit: Admitting: Cardiovascular Disease

## 2024-02-09 ENCOUNTER — Telehealth: Payer: Self-pay

## 2024-02-09 DIAGNOSIS — E119 Type 2 diabetes mellitus without complications: Secondary | ICD-10-CM

## 2024-02-09 NOTE — Progress Notes (Signed)
 02/09/2024 Name: Jorge Gregory MRN: [id number] DOB: [birth year]  Chief Complaint  Patient presents with   Medication Management   Diabetes   Hypertension    Jorge Gregory is a 68 y.o. year old male who presented for a telephone visit.   They were referred to the pharmacist by their PCP for assistance in managing complex medication management.    Subjective:  Care Team: Primary Care Provider: Fredy Bathe, MD ; Next Scheduled Visit: 03/22/24  Medication Access/Adherence  Current Pharmacy:  [facility] [address] [facility] - [place], [place] - [address] [facility] [address] [place] [address] Phone: [phone] Fax: [phone]   Patient reports affordability concerns with their medications: No  Patient reports access/transportation concerns to their pharmacy: No  Patient reports adherence concerns with their medications:  No     Diabetes:  Current medications: Toujeo  25 units BID, Janumet  50-1000mg  BID Medications tried in the past: Farxiga  (made him dizzy)  Current glucose readings: Uses finger prick meter, checks about once a week and usually sees numbers in 130s   Observed patterns:  Patient denies hypoglycemic s/sx including dizziness, shakiness, sweating. Patient denies hyperglycemic symptoms including polyuria, polydipsia, polyphagia, nocturia, neuropathy, blurred vision.   Macrovascular and Microvascular Risk Reduction:  Statin? yes (Rosuvastatin ); ACEi/ARB? yes (losartan ) Last urinary albumin/creatinine ratio:  Lab Results  Component Value Date   MICRALBCREAT 97 (H) 12/22/2023   MICRALBCREAT 30-300 12/22/2023   MICRALBCREAT 24.1 02/18/2007   Last eye exam:   Last foot exam: No foot exam found Tobacco Use:  Tobacco Use: Low Risk  (01/04/2024)   Patient History    Smoking Tobacco Use: Never    Smokeless Tobacco Use: Never    Passive Exposure: Not on file   Hypertension:  Current medications: Losartan  50mg  BID, Metoprolol   XL 25mg  Medications previously tried: Entresto , Isosorbide   Patient has a validated, automated, upper arm home BP cuff Current blood pressure readings readings: 130s/70-80s, reports he checks twice daily, his daughter is in nursing school and assists  Patient denies hypotensive s/sx including dizziness, lightheadedness.  Patient denies hypertensive symptoms including headache, chest pain, shortness of breath   Hyperlipidemia/ASCVD Risk Reduction  Current lipid lowering medications: Rosuvastatin  40mg   Antiplatelet regimen: Aspirin  81mg     Objective:  Lab Results  Component Value Date   HGBA1C 6.5 (H) 12/22/2023    Lab Results  Component Value Date   CREATININE 0.93 12/22/2023   BUN 22 12/22/2023   NA 138 12/22/2023   K 5.2 12/22/2023   CL 107 (H) 12/22/2023   CO2 20 12/22/2023    Lab Results  Component Value Date   CHOL 110 12/22/2023   HDL 42 12/22/2023   LDLCALC 47 12/22/2023   TRIG 114 12/22/2023   CHOLHDL 4.2 CALC 02/18/2007    Medications Reviewed Today     Reviewed by Lionell Jon DEL, RPH (Pharmacist) on 02/09/24 at 1140  Med List Status: <None>   Medication Order Taking? Sig Documenting Provider Last Dose Status Informant  aspirin  EC 81 MG tablet [id number] Yes Take 1 tablet (81 mg total) by mouth daily. Swallow whole. Bathe Denyse DELENA, MD  Active Self  cetirizine (ZYRTEC) 10 MG tablet [id number] Yes TAKE ONE TABLET BY MOUTH ONE TIME DAILY AS NEEDED Bathe Fredy RAMAN, MD  Active   dapagliflozin  propanediol (FARXIGA ) 10 MG TABS tablet [id number]  Take 1 tablet (10 mg total) by mouth daily before breakfast. Bathe Denyse DELENA, MD  Active   insulin glargine ,  2 Unit Dial, (TOUJEO  MAX SOLOSTAR) 300 UNIT/ML Solostar Pen [id number] Yes Inject 25 Units into the skin in the morning and at bedtime. Fernand Fredy RAMAN, MD  Active   losartan  (COZAAR ) 50 MG tablet [id number] Yes Take 1 tablet (50 mg total) by mouth 2 (two) times daily. Fernand Alter A, MD  Active   magnesium 30 MG  tablet [id number] Yes Take 30 mg by mouth once. [provider]  Active   metoprolol  succinate (TOPROL  XL) 25 MG 24 hr tablet [id number] Yes Take 1 tablet (25 mg total) by mouth daily. Fernand Alter LABOR, MD  Active   Multiple Vitamins-Minerals (MENS MULTIVITAMIN PLUS PO) [id number] Yes Take 1 capsule by mouth daily. [provider]  Active   naproxen (NAPROSYN) 250 MG tablet [id number] Yes Take 250 mg by mouth 2 (two) times daily with a meal. [provider]  Active Self  nitroGLYCERIN  (NITROSTAT ) 0.4 MG SL tablet [id number]  Place 1 tablet (0.4 mg total) under the tongue every 5 (five) minutes as needed for chest pain. Fernand Alter LABOR, MD  Active   rosuvastatin  (CRESTOR ) 40 MG tablet [id number] Yes TAKE ONE TABLET BY MOUTH ONE TIME DAILY Fernand Alter LABOR, MD  Active   sitaGLIPtin -metformin  (JANUMET ) 50-1000 MG tablet [id number] Yes Take 1 tablet by mouth 2 (two) times daily with a meal. Fernand Fredy RAMAN, MD  Active               Assessment/Plan:   Diabetes: - Currently controlled; goal A1c <7%. Cardiorenal risk reduction is optimized.. Blood pressure is not at goal <130/80. LDL is at goal.  - Recommend to continue current medication therapy. Discussed potential positive impact of starting CGM, patient declines. Prefers to routinely monitor with finger prick checks. -Addressed gaps in med fill history for Janumet , reports he received a lot of samples from his previous MD before they closed but denies any missed doses of medications  Hypertension: - Currently uncontrolled per office readings but reporting controlled readings at home - Reviewed long term cardiovascular and renal outcomes of uncontrolled blood pressure - Reviewed appropriate blood pressure monitoring technique and reviewed goal blood pressure. Recommended to check home blood pressure and heart rate daily - Recommend to continue current med therapy      Hyperlipidemia/ASCVD Risk Reduction: - Currently  controlled.  - Recommend to continue current med therapy   Follow Up Plan: 3 months  Jon VEAR [place], PharmD Clinical Pharmacist [phone]

## 2024-02-24 ENCOUNTER — Other Ambulatory Visit: Payer: Self-pay | Admitting: Internal Medicine

## 2024-03-22 ENCOUNTER — Ambulatory Visit: Admitting: Internal Medicine

## 2024-03-22 ENCOUNTER — Other Ambulatory Visit: Payer: Self-pay | Admitting: Cardiovascular Disease

## 2024-03-22 DIAGNOSIS — I2510 Atherosclerotic heart disease of native coronary artery without angina pectoris: Secondary | ICD-10-CM

## 2024-03-22 DIAGNOSIS — E782 Mixed hyperlipidemia: Secondary | ICD-10-CM

## 2024-03-22 DIAGNOSIS — R0602 Shortness of breath: Secondary | ICD-10-CM

## 2024-03-22 DIAGNOSIS — R0789 Other chest pain: Secondary | ICD-10-CM

## 2024-03-22 DIAGNOSIS — I5022 Chronic systolic (congestive) heart failure: Secondary | ICD-10-CM

## 2024-03-22 DIAGNOSIS — I110 Hypertensive heart disease with heart failure: Secondary | ICD-10-CM

## 2024-03-22 DIAGNOSIS — I10 Essential (primary) hypertension: Secondary | ICD-10-CM

## 2024-04-04 ENCOUNTER — Ambulatory Visit: Admitting: Cardiovascular Disease

## 2024-04-04 ENCOUNTER — Encounter: Payer: Self-pay | Admitting: Cardiovascular Disease

## 2024-04-04 VITALS — BP 123/73 | HR 71 | Ht 69.0 in | Wt 177.2 lb

## 2024-04-04 DIAGNOSIS — Z131 Encounter for screening for diabetes mellitus: Secondary | ICD-10-CM

## 2024-04-04 DIAGNOSIS — R0789 Other chest pain: Secondary | ICD-10-CM | POA: Diagnosis not present

## 2024-04-04 DIAGNOSIS — R0602 Shortness of breath: Secondary | ICD-10-CM

## 2024-04-04 DIAGNOSIS — I5022 Chronic systolic (congestive) heart failure: Secondary | ICD-10-CM | POA: Diagnosis not present

## 2024-04-04 DIAGNOSIS — Z951 Presence of aortocoronary bypass graft: Secondary | ICD-10-CM

## 2024-04-04 DIAGNOSIS — I43 Cardiomyopathy in diseases classified elsewhere: Secondary | ICD-10-CM

## 2024-04-04 DIAGNOSIS — I110 Hypertensive heart disease with heart failure: Secondary | ICD-10-CM

## 2024-04-04 DIAGNOSIS — I10 Essential (primary) hypertension: Secondary | ICD-10-CM | POA: Diagnosis not present

## 2024-04-04 DIAGNOSIS — E782 Mixed hyperlipidemia: Secondary | ICD-10-CM | POA: Diagnosis not present

## 2024-04-04 MED ORDER — ISOSORBIDE MONONITRATE ER 30 MG PO TB24: 30.0000 mg | ORAL_TABLET | Freq: Every day | ORAL | 1 refills | Status: AC

## 2024-04-04 NOTE — Progress Notes (Signed)
 Cardiology Office Note   Date:  04/04/2024   ID:  Jorge Gregory, DOB Jan 04, 1956, MRN [id number]  PCP:  Christi Vannie PARAS, MD  Cardiologist:  Denyse Bathe, MD      History of Present Illness: Jorge Gregory is a 68 y.o. male who presents for  Chief Complaint  Patient presents with   Follow-up    3 month follow up. Chest pain and numbness in both arms    Has sharp pain and numbness on left side of chest.Also SOB.      Past Medical History:  Diagnosis Date   Diabetes mellitus without complication (HCC)    Hep C w/ coma, chronic      Past Surgical History:  Procedure Laterality Date   CHOLECYSTECTOMY     LEFT HEART CATH AND CORONARY ANGIOGRAPHY Left 08/06/2023   Procedure: LEFT HEART CATH AND CORONARY ANGIOGRAPHY with possible coronary intervention;  Surgeon: Bathe Denyse DELENA, MD;  Location: [facility];  Service: Cardiovascular;  Laterality: Left;     Current Outpatient Medications  Medication Sig Dispense Refill   aspirin  EC 81 MG tablet Take 1 tablet (81 mg total) by mouth daily. Swallow whole. 30 tablet 12   cetirizine (ZYRTEC) 10 MG tablet TAKE ONE TABLET BY MOUTH ONE TIME DAILY AS NEEDED 90 tablet 2   insulin glargine , 2 Unit Dial, (TOUJEO  MAX SOLOSTAR) 300 UNIT/ML Solostar Pen Inject 25 Units into the skin in the morning and at bedtime. 15 mL 0   isosorbide  mononitrate (IMDUR ) 30 MG 24 hr tablet Take 1 tablet (30 mg total) by mouth daily. 30 tablet 1   losartan  (COZAAR ) 50 MG tablet Take 1 tablet (50 mg total) by mouth 2 (two) times daily. 60 tablet 11   magnesium 30 MG tablet Take 30 mg by mouth once.     metoprolol  succinate (TOPROL  XL) 25 MG 24 hr tablet Take 1 tablet (25 mg total) by mouth daily. 30 tablet 3   Multiple Vitamins-Minerals (MENS MULTIVITAMIN PLUS PO) Take 1 capsule by mouth daily.     naproxen (NAPROSYN) 250 MG tablet Take 250 mg by mouth 2 (two) times daily with a meal.     nitroGLYCERIN  (NITROSTAT ) 0.4 MG SL tablet Place 1 tablet  (0.4 mg total) under the tongue every 5 (five) minutes as needed for chest pain. 100 tablet 3   rosuvastatin  (CRESTOR ) 40 MG tablet TAKE 1 TABLET BY MOUTH ONCE DAILY. 30 tablet 2   No current facility-administered medications for this visit.    Allergies:   Fluoxetine hcl, Percocet [oxycodone -acetaminophen ], and Prednisone    Social History:   reports that he has never smoked. He has never used smokeless tobacco. He reports that he does not drink alcohol. No history on file for drug use.   Family History:  family history is not on file.    ROS:     Review of Systems  Constitutional: Negative.   HENT: Negative.    Eyes: Negative.   Respiratory: Negative.    Gastrointestinal: Negative.   Genitourinary: Negative.   Musculoskeletal: Negative.   Skin: Negative.   Neurological: Negative.   Endo/Heme/Allergies: Negative.   Psychiatric/Behavioral: Negative.    All other systems reviewed and are negative.     All other systems are reviewed and negative.    PHYSICAL EXAM: VS:  BP 123/73   Pulse 71   Ht 5' 9 (1.753 m)   Wt 177 lb 3.2 oz (80.4 kg)   SpO2 95%  BMI 26.17 kg/m  , BMI Body mass index is 26.17 kg/m. Last weight:  Wt Readings from Last 3 Encounters:  04/04/24 177 lb 3.2 oz (80.4 kg)  01/04/24 180 lb (81.6 kg)  01/04/24 180 lb 9.6 oz (81.9 kg)     Physical Exam Vitals reviewed.  Constitutional:      Appearance: Normal appearance. He is normal weight.  HENT:     Head: Normocephalic.     Nose: Nose normal.     Mouth/Throat:     Mouth: Mucous membranes are moist.  Eyes:     Pupils: Pupils are equal, round, and reactive to light.  Cardiovascular:     Rate and Rhythm: Normal rate and regular rhythm.     Pulses: Normal pulses.     Heart sounds: Normal heart sounds.  Pulmonary:     Effort: Pulmonary effort is normal.  Abdominal:     General: Abdomen is flat. Bowel sounds are normal.  Musculoskeletal:        General: Normal range of motion.     Cervical  back: Normal range of motion.  Skin:    General: Skin is warm.  Neurological:     General: No focal deficit present.     Mental Status: He is alert.  Psychiatric:        Mood and Affect: Mood normal.       EKG:   Recent Labs: 12/22/2023: ALT 23; BUN 22; Creatinine, Ser 0.93; Hemoglobin 13.4; Platelets 178; Potassium 5.2; Sodium 138; TSH 2.690    Lipid Panel    Component Value Date/Time   CHOL 110 12/22/2023 1028   TRIG 114 12/22/2023 1028   HDL 42 12/22/2023 1028   CHOLHDL 4.2 CALC 02/18/2007 1024   VLDL 17 02/18/2007 1024   LDLCALC 47 12/22/2023 1028      Other studies Reviewed: Additional studies/ records that were reviewed today include:  Review of the above records demonstrates:       No data to display            ASSESSMENT AND PLAN:    ICD-10-CM   1. Mixed hyperlipidemia  E78.2 PCV ECHOCARDIOGRAM COMPLETE    MYOCARDIAL PERFUSION IMAGING    isosorbide  mononitrate (IMDUR ) 30 MG 24 hr tablet    2. Primary hypertension  I10 PCV ECHOCARDIOGRAM COMPLETE    MYOCARDIAL PERFUSION IMAGING    isosorbide  mononitrate (IMDUR ) 30 MG 24 hr tablet    3. SOB (shortness of breath)  R06.02 PCV ECHOCARDIOGRAM COMPLETE    MYOCARDIAL PERFUSION IMAGING    isosorbide  mononitrate (IMDUR ) 30 MG 24 hr tablet    4. Other chest pain  R07.89 PCV ECHOCARDIOGRAM COMPLETE    MYOCARDIAL PERFUSION IMAGING    isosorbide  mononitrate (IMDUR ) 30 MG 24 hr tablet   stress test and echo    5. CHF (congestive heart failure), NYHA class I, chronic, systolic (HCC)  I50.22 PCV ECHOCARDIOGRAM COMPLETE    MYOCARDIAL PERFUSION IMAGING    isosorbide  mononitrate (IMDUR ) 30 MG 24 hr tablet    6. Shortness of breath  R06.02 PCV ECHOCARDIOGRAM COMPLETE    MYOCARDIAL PERFUSION IMAGING    isosorbide  mononitrate (IMDUR ) 30 MG 24 hr tablet    7. Cardiomyopathy due to hypertension, with heart failure (HCC)  I11.0 PCV ECHOCARDIOGRAM COMPLETE   I43 MYOCARDIAL PERFUSION IMAGING    isosorbide   mononitrate (IMDUR ) 30 MG 24 hr tablet    8. S/P CABG (coronary artery bypass graft)  Z95.1 PCV ECHOCARDIOGRAM COMPLETE    MYOCARDIAL PERFUSION  IMAGING    isosorbide  mononitrate (IMDUR ) 30 MG 24 hr tablet   Had CABG LIMA to LAD,, SVG to OM and SVG to PDA. Has chest pains advise stress test       Problem List Items Addressed This Visit       Cardiovascular and Mediastinum   Cardiomyopathy due to hypertension, with heart failure (HCC)   Relevant Medications   isosorbide  mononitrate (IMDUR ) 30 MG 24 hr tablet   Other Relevant Orders   PCV ECHOCARDIOGRAM COMPLETE   MYOCARDIAL PERFUSION IMAGING   CHF (congestive heart failure), NYHA class I, chronic, systolic (HCC)   Relevant Medications   isosorbide  mononitrate (IMDUR ) 30 MG 24 hr tablet   Other Relevant Orders   PCV ECHOCARDIOGRAM COMPLETE   MYOCARDIAL PERFUSION IMAGING     Other   Other chest pain   Relevant Medications   isosorbide  mononitrate (IMDUR ) 30 MG 24 hr tablet   Other Relevant Orders   PCV ECHOCARDIOGRAM COMPLETE   MYOCARDIAL PERFUSION IMAGING   SOB (shortness of breath)   Relevant Medications   isosorbide  mononitrate (IMDUR ) 30 MG 24 hr tablet   Other Relevant Orders   PCV ECHOCARDIOGRAM COMPLETE   MYOCARDIAL PERFUSION IMAGING   Mixed hyperlipidemia - Primary   Relevant Medications   isosorbide  mononitrate (IMDUR ) 30 MG 24 hr tablet   Other Relevant Orders   PCV ECHOCARDIOGRAM COMPLETE   MYOCARDIAL PERFUSION IMAGING   Other Visit Diagnoses       Primary hypertension       Relevant Medications   isosorbide  mononitrate (IMDUR ) 30 MG 24 hr tablet   Other Relevant Orders   PCV ECHOCARDIOGRAM COMPLETE   MYOCARDIAL PERFUSION IMAGING     Shortness of breath       Relevant Medications   isosorbide  mononitrate (IMDUR ) 30 MG 24 hr tablet   Other Relevant Orders   PCV ECHOCARDIOGRAM COMPLETE   MYOCARDIAL PERFUSION IMAGING     S/P CABG (coronary artery bypass graft)       Had CABG LIMA to LAD,, SVG to OM and  SVG to PDA. Has chest pains advise stress test   Relevant Medications   isosorbide  mononitrate (IMDUR ) 30 MG 24 hr tablet   Other Relevant Orders   PCV ECHOCARDIOGRAM COMPLETE   MYOCARDIAL PERFUSION IMAGING          Disposition:   Return in about 4 weeks (around 05/02/2024) for echo, stress test and f/u.    Total time spent: 30 minutes  Signed,  Denyse Bathe, MD  04/04/2024 9:32 AM    [facility]

## 2024-04-12 ENCOUNTER — Other Ambulatory Visit

## 2024-04-12 DIAGNOSIS — I371 Nonrheumatic pulmonary valve insufficiency: Secondary | ICD-10-CM

## 2024-04-12 DIAGNOSIS — I340 Nonrheumatic mitral (valve) insufficiency: Secondary | ICD-10-CM

## 2024-04-12 DIAGNOSIS — E782 Mixed hyperlipidemia: Secondary | ICD-10-CM

## 2024-04-12 DIAGNOSIS — I5022 Chronic systolic (congestive) heart failure: Secondary | ICD-10-CM

## 2024-04-12 DIAGNOSIS — R0602 Shortness of breath: Secondary | ICD-10-CM

## 2024-04-12 DIAGNOSIS — R0789 Other chest pain: Secondary | ICD-10-CM

## 2024-04-12 DIAGNOSIS — I10 Essential (primary) hypertension: Secondary | ICD-10-CM

## 2024-04-12 DIAGNOSIS — Z951 Presence of aortocoronary bypass graft: Secondary | ICD-10-CM

## 2024-04-12 DIAGNOSIS — I110 Hypertensive heart disease with heart failure: Secondary | ICD-10-CM

## 2024-04-28 ENCOUNTER — Ambulatory Visit

## 2024-04-28 DIAGNOSIS — Z951 Presence of aortocoronary bypass graft: Secondary | ICD-10-CM | POA: Diagnosis not present

## 2024-04-28 DIAGNOSIS — E782 Mixed hyperlipidemia: Secondary | ICD-10-CM

## 2024-04-28 DIAGNOSIS — R0789 Other chest pain: Secondary | ICD-10-CM | POA: Diagnosis not present

## 2024-04-28 DIAGNOSIS — I43 Cardiomyopathy in diseases classified elsewhere: Secondary | ICD-10-CM | POA: Diagnosis not present

## 2024-04-28 DIAGNOSIS — I10 Essential (primary) hypertension: Secondary | ICD-10-CM

## 2024-04-28 DIAGNOSIS — I5022 Chronic systolic (congestive) heart failure: Secondary | ICD-10-CM | POA: Diagnosis not present

## 2024-04-28 DIAGNOSIS — R0602 Shortness of breath: Secondary | ICD-10-CM | POA: Diagnosis not present

## 2024-04-28 DIAGNOSIS — I110 Hypertensive heart disease with heart failure: Secondary | ICD-10-CM

## 2024-05-03 ENCOUNTER — Other Ambulatory Visit: Payer: Self-pay

## 2024-05-03 DIAGNOSIS — E119 Type 2 diabetes mellitus without complications: Secondary | ICD-10-CM

## 2024-05-03 NOTE — Progress Notes (Signed)
 "  05/03/2024 Name: Jorge Gregory MRN: [id number] DOB: Aug 16, 1955  Chief Complaint  Patient presents with   Medication Management   Diabetes    Jorge Gregory is a 69 y.o. year old male who presented for a telephone visit.   They were referred to the pharmacist by their PCP for assistance in managing complex medication management.    Subjective:  Care Team: Primary Care Provider: Fredy Bathe, MD ; Next Scheduled Visit: Not scheduled - accidentally missed  Medication Access/Adherence  Current Pharmacy:  [facility] [address] [facility] - [place], [place] - [address] AT [facility] [address] [place] [address] Phone: [phone] Fax: [phone]  [facility] - [place], [place] - [address] [place] [address] Phone: [phone] Fax: [phone]   Patient reports affordability concerns with their medications: No  Patient reports access/transportation concerns to their pharmacy: No  Patient reports adherence concerns with their medications:  No     Diabetes:  Current medications:  Janumet  50-1000mg  BID Medications tried in the past: Farxiga  (made him dizzy), Toujeo  (self stopped, felt sugars were controlled enough without it)  Current glucose readings: Uses finger prick meter, checks about 3-4x/ week and usually sees numbers below 130 post-prandial   Observed patterns:  Patient denies hypoglycemic s/sx including dizziness, shakiness, sweating. Patient denies hyperglycemic symptoms including polyuria, polydipsia, polyphagia, nocturia, neuropathy, blurred vision.   Macrovascular and Microvascular Risk Reduction:  Statin? yes (Rosuvastatin ); ACEi/ARB? yes (losartan ) Last urinary albumin/creatinine ratio:  Lab Results  Component Value Date   MICRALBCREAT 97 (H) 12/22/2023   MICRALBCREAT 30-300 12/22/2023   MICRALBCREAT 24.1 02/18/2007   Last eye exam:   Last foot exam: No foot exam found Tobacco Use:  Tobacco  Use: Low Risk (04/04/2024)   Patient History    Smoking Tobacco Use: Never    Smokeless Tobacco Use: Never    Passive Exposure: Not on file   Hypertension:  Current medications: Losartan  50mg  BID, Metoprolol  XL 25mg  Medications previously tried: Entresto , Isosorbide   Patient has a validated, automated, upper arm home BP cuff Current blood pressure readings readings: 130s/70-80s, reports he checks twice daily  Patient denies hypotensive s/sx including dizziness, lightheadedness.  Patient denies hypertensive symptoms including headache, chest pain, shortness of breath   Hyperlipidemia/ASCVD Risk Reduction  Current lipid lowering medications: Rosuvastatin  40mg   Antiplatelet regimen: Aspirin  81mg     Objective:  Lab Results  Component Value Date   HGBA1C 6.5 (H) 12/22/2023    Lab Results  Component Value Date   CREATININE 0.93 12/22/2023   BUN 22 12/22/2023   NA 138 12/22/2023   K 5.2 12/22/2023   CL 107 (H) 12/22/2023   CO2 20 12/22/2023    Lab Results  Component Value Date   CHOL 110 12/22/2023   HDL 42 12/22/2023   LDLCALC 47 12/22/2023   TRIG 114 12/22/2023   CHOLHDL 4.2 CALC 02/18/2007    Medications Reviewed Today     Reviewed by Lionell Jon DEL, RPH (Pharmacist) on 05/03/24 at 1142  Med List Status: <None>   Medication Order Taking? Sig Documenting Provider Last Dose Status Informant  aspirin  EC 81 MG tablet [id number] Yes Take 1 tablet (81 mg total) by mouth daily. Swallow whole. Bathe Denyse DELENA, MD  Active Self  cetirizine (ZYRTEC) 10 MG tablet [id number] Yes TAKE ONE TABLET BY MOUTH ONE TIME DAILY AS NEEDED Bathe Fredy GORMAN, MD  Active   insulin glargine , 2 Unit Dial, (TOUJEO  MAX  SOLOSTAR) 300 UNIT/ML Solostar Pen [id number]  Inject 25 Units into the skin in the morning and at bedtime.  Patient not taking: Reported on 05/03/2024   Fernand Fredy RAMAN, MD  Active   isosorbide  mononitrate (IMDUR ) 30 MG 24 hr tablet [id number] Yes Take 1 tablet (30 mg total) by  mouth daily. Fernand Denyse LABOR, MD  Active   losartan  (COZAAR ) 50 MG tablet [id number] Yes Take 1 tablet (50 mg total) by mouth 2 (two) times daily. Fernand Denyse A, MD  Active   magnesium 30 MG tablet [id number] Yes Take 30 mg by mouth once. [provider]  Active   metoprolol  succinate (TOPROL  XL) 25 MG 24 hr tablet [id number] Yes Take 1 tablet (25 mg total) by mouth daily. Fernand Denyse LABOR, MD  Active   Multiple Vitamins-Minerals (MENS MULTIVITAMIN PLUS PO) [id number] Yes Take 1 capsule by mouth daily. [provider]  Active   naproxen (NAPROSYN) 250 MG tablet [id number] Yes Take 250 mg by mouth 2 (two) times daily with a meal. [provider]  Active Self  nitroGLYCERIN  (NITROSTAT ) 0.4 MG SL tablet [id number] Yes Place 1 tablet (0.4 mg total) under the tongue every 5 (five) minutes as needed for chest pain. Fernand Denyse LABOR, MD  Active   rosuvastatin  (CRESTOR ) 40 MG tablet [id number] Yes TAKE 1 TABLET BY MOUTH ONCE DAILY. Fernand Denyse LABOR, MD  Active               Assessment/Plan:   Diabetes: - Currently controlled; goal A1c <7%. Cardiorenal risk reduction is optimized.. Blood pressure is not at goal <130/80. LDL is at goal.  - Recommend to continue current medication therapy. Discussed potential positive impact of starting CGM, patient declines. Prefers to routinely monitor with finger prick checks. -Addressed gaps in med fill history for Janumet , reports he received a lot of samples from his previous MD before they closed but denies any missed doses of medications -Reminded of missed PCP visit, patient plans to r/s it when he comes into the office to see cardio on 1/15  Hypertension: - Currently controlled - Reviewed long term cardiovascular and renal outcomes of uncontrolled blood pressure - Reviewed appropriate blood pressure monitoring technique and reviewed goal blood pressure. Recommended to check home blood pressure and heart rate daily - Recommend to  continue current med therapy      Hyperlipidemia/ASCVD Risk Reduction: - Currently controlled.  - Recommend to continue current med therapy   Follow Up Plan: 3 months  Jon VEAR [place], PharmD Clinical Pharmacist [phone]  "

## 2024-05-05 ENCOUNTER — Encounter: Payer: Self-pay | Admitting: Cardiovascular Disease

## 2024-05-05 ENCOUNTER — Telehealth: Payer: Self-pay | Admitting: Cardiovascular Disease

## 2024-05-05 ENCOUNTER — Ambulatory Visit: Admitting: Cardiovascular Disease

## 2024-05-05 VITALS — BP 122/64 | HR 73 | Ht 69.0 in | Wt 179.0 lb

## 2024-05-05 DIAGNOSIS — E782 Mixed hyperlipidemia: Secondary | ICD-10-CM

## 2024-05-05 DIAGNOSIS — I110 Hypertensive heart disease with heart failure: Secondary | ICD-10-CM | POA: Diagnosis not present

## 2024-05-05 DIAGNOSIS — I340 Nonrheumatic mitral (valve) insufficiency: Secondary | ICD-10-CM | POA: Diagnosis not present

## 2024-05-05 DIAGNOSIS — Z951 Presence of aortocoronary bypass graft: Secondary | ICD-10-CM

## 2024-05-05 DIAGNOSIS — R0602 Shortness of breath: Secondary | ICD-10-CM

## 2024-05-05 DIAGNOSIS — I43 Cardiomyopathy in diseases classified elsewhere: Secondary | ICD-10-CM

## 2024-05-05 DIAGNOSIS — I10 Essential (primary) hypertension: Secondary | ICD-10-CM | POA: Diagnosis not present

## 2024-05-05 DIAGNOSIS — I5022 Chronic systolic (congestive) heart failure: Secondary | ICD-10-CM | POA: Diagnosis not present

## 2024-05-05 DIAGNOSIS — R0789 Other chest pain: Secondary | ICD-10-CM | POA: Diagnosis not present

## 2024-05-05 NOTE — Progress Notes (Signed)
 "     Cardiology Office Note   Date:  05/05/2024   ID:  Jorge Gregory, DOB 03-17-1956, MRN [id number]  PCP:  Christi Vannie PARAS, MD  Cardiologist:  Denyse Bathe, MD      History of Present Illness: Jorge Gregory is a 69 y.o. male who presents for  Chief Complaint  Patient presents with   Follow-up    Results ECHO & NST    Still SOB,  chest pain easing. Out of balance.      Past Medical History:  Diagnosis Date   Diabetes mellitus without complication (HCC)    Hep C w/ coma, chronic      Past Surgical History:  Procedure Laterality Date   CHOLECYSTECTOMY     LEFT HEART CATH AND CORONARY ANGIOGRAPHY Left 08/06/2023   Procedure: LEFT HEART CATH AND CORONARY ANGIOGRAPHY with possible coronary intervention;  Surgeon: Bathe Denyse DELENA, MD;  Location: [facility] IN[facility];  Service: Cardiovascular;  Laterality: Left;     Current Outpatient Medications  Medication Sig Dispense Refill   aspirin  EC 81 MG tablet Take 1 tablet (81 mg total) by mouth daily. Swallow whole. 30 tablet 12   cetirizine (ZYRTEC) 10 MG tablet TAKE ONE TABLET BY MOUTH ONE TIME DAILY AS NEEDED 90 tablet 2   insulin glargine , 2 Unit Dial, (TOUJEO  MAX SOLOSTAR) 300 UNIT/ML Solostar Pen Inject 25 Units into the skin in the morning and at bedtime. (Patient not taking: Reported on 05/03/2024) 15 mL 0   isosorbide  mononitrate (IMDUR ) 30 MG 24 hr tablet Take 1 tablet (30 mg total) by mouth daily. 30 tablet 1   losartan  (COZAAR ) 50 MG tablet Take 1 tablet (50 mg total) by mouth 2 (two) times daily. 60 tablet 11   magnesium 30 MG tablet Take 30 mg by mouth once.     metoprolol  succinate (TOPROL  XL) 25 MG 24 hr tablet Take 1 tablet (25 mg total) by mouth daily. 30 tablet 3   Multiple Vitamins-Minerals (MENS MULTIVITAMIN PLUS PO) Take 1 capsule by mouth daily.     naproxen (NAPROSYN) 250 MG tablet Take 250 mg by mouth 2 (two) times daily with a meal.     nitroGLYCERIN  (NITROSTAT ) 0.4 MG SL tablet Place 1 tablet (0.4 mg  total) under the tongue every 5 (five) minutes as needed for chest pain. 100 tablet 3   rosuvastatin  (CRESTOR ) 40 MG tablet TAKE 1 TABLET BY MOUTH ONCE DAILY. 30 tablet 2   No current facility-administered medications for this visit.    Allergies:   Fluoxetine hcl, Percocet [oxycodone -acetaminophen ], and Prednisone    Social History:   reports that he has never smoked. He has never used smokeless tobacco. He reports that he does not drink alcohol. No history on file for drug use.   Family History:  family history is not on file.    ROS:     Review of Systems  Constitutional: Negative.   HENT: Negative.    Eyes: Negative.   Respiratory: Negative.    Gastrointestinal: Negative.   Genitourinary: Negative.   Musculoskeletal: Negative.   Skin: Negative.   Neurological: Negative.   Endo/Heme/Allergies: Negative.   Psychiatric/Behavioral: Negative.    All other systems reviewed and are negative.     All other systems are reviewed and negative.    PHYSICAL EXAM: VS:  BP 122/64   Pulse 73   Ht 5' 9 (1.753 m)   Wt 179 lb (81.2 kg)   SpO2 96%   BMI  26.43 kg/m  , BMI Body mass index is 26.43 kg/m. Last weight:  Wt Readings from Last 3 Encounters:  05/05/24 179 lb (81.2 kg)  04/04/24 177 lb 3.2 oz (80.4 kg)  01/04/24 180 lb (81.6 kg)     Physical Exam Vitals reviewed.  Constitutional:      Appearance: Normal appearance. He is normal weight.  HENT:     Head: Normocephalic.     Nose: Nose normal.     Mouth/Throat:     Mouth: Mucous membranes are moist.  Eyes:     Pupils: Pupils are equal, round, and reactive to light.  Cardiovascular:     Rate and Rhythm: Normal rate and regular rhythm.     Pulses: Normal pulses.     Heart sounds: Normal heart sounds.  Pulmonary:     Effort: Pulmonary effort is normal.  Abdominal:     General: Abdomen is flat. Bowel sounds are normal.  Musculoskeletal:        General: Normal range of motion.     Cervical back: Normal range of  motion.  Skin:    General: Skin is warm.  Neurological:     General: No focal deficit present.     Mental Status: He is alert.  Psychiatric:        Mood and Affect: Mood normal.       EKG:   Recent Labs: 12/22/2023: ALT 23; BUN 22; Creatinine, Ser 0.93; Hemoglobin 13.4; Platelets 178; Potassium 5.2; Sodium 138; TSH 2.690    Lipid Panel    Component Value Date/Time   CHOL 110 12/22/2023 1028   TRIG 114 12/22/2023 1028   HDL 42 12/22/2023 1028   CHOLHDL 4.2 CALC 02/18/2007 1024   VLDL 17 02/18/2007 1024   LDLCALC 47 12/22/2023 1028      Other studies Reviewed: Additional studies/ records that were reviewed today include:  Review of the above records demonstrates:       No data to display            ASSESSMENT AND PLAN:    ICD-10-CM   1. S/P CABG (coronary artery bypass graft)  Z95.1    mild reversible lateral defect without CP    2. Cardiomyopathy due to hypertension, with heart failure (HCC)  I11.0    I43     3. Shortness of breath  R06.02     4. CHF (congestive heart failure), NYHA class I, chronic, systolic (HCC)  I50.22    Jaurdiance caused dizziness,as well as farxiga     5. Other chest pain  R07.89     6. SOB (shortness of breath)  R06.02    diastolic dysfunction, add jaurdiance.    7. Primary hypertension  I10     8. Mixed hyperlipidemia  E78.2     9. Nonrheumatic mitral valve regurgitation  I34.0    Mild MR normal Ef diastolic dysfunction       Problem List Items Addressed This Visit       Cardiovascular and Mediastinum   Cardiomyopathy due to hypertension, with heart failure (HCC)   CHF (congestive heart failure), NYHA class I, chronic, systolic (HCC)     Other   Other chest pain   SOB (shortness of breath)   Mixed hyperlipidemia   Other Visit Diagnoses       S/P CABG (coronary artery bypass graft)    -  Primary   mild reversible lateral defect without CP     Shortness of breath  Primary hypertension          Nonrheumatic mitral valve regurgitation       Mild MR normal Ef diastolic dysfunction          Disposition:   Return in about 2 months (around 07/03/2024).    Total time spent: 35 minutes  Signed,  Denyse Bathe, MD  05/05/2024 9:38 AM    [facility] "

## 2024-05-05 NOTE — Telephone Encounter (Signed)
 Pt scheduled NP appt with Dr. Gollan 1/19. Pt wanted Dr to know he is seeking a second opinion regarding his leaky valve post quadruple bypass.

## 2024-05-06 ENCOUNTER — Ambulatory Visit
Admission: RE | Admit: 2024-05-06 | Discharge: 2024-05-06 | Disposition: A | Discharge status: Home / Self Care | Source: Ambulatory Visit | Attending: Internal Medicine | Admitting: Internal Medicine

## 2024-05-06 ENCOUNTER — Ambulatory Visit: Admitting: Internal Medicine

## 2024-05-06 ENCOUNTER — Ambulatory Visit
Admission: RE | Admit: 2024-05-06 | Discharge: 2024-05-06 | Disposition: A | Discharge status: Home / Self Care | Attending: Internal Medicine | Admitting: Internal Medicine

## 2024-05-06 ENCOUNTER — Encounter: Payer: Self-pay | Admitting: Internal Medicine

## 2024-05-06 VITALS — BP 150/80 | HR 73 | Ht 69.0 in | Wt 180.0 lb

## 2024-05-06 DIAGNOSIS — E782 Mixed hyperlipidemia: Secondary | ICD-10-CM | POA: Diagnosis not present

## 2024-05-06 DIAGNOSIS — E1165 Type 2 diabetes mellitus with hyperglycemia: Secondary | ICD-10-CM

## 2024-05-06 DIAGNOSIS — R0602 Shortness of breath: Secondary | ICD-10-CM | POA: Diagnosis present

## 2024-05-06 DIAGNOSIS — I110 Hypertensive heart disease with heart failure: Secondary | ICD-10-CM

## 2024-05-06 DIAGNOSIS — E1169 Type 2 diabetes mellitus with other specified complication: Secondary | ICD-10-CM | POA: Diagnosis not present

## 2024-05-06 DIAGNOSIS — Z125 Encounter for screening for malignant neoplasm of prostate: Secondary | ICD-10-CM | POA: Diagnosis not present

## 2024-05-06 DIAGNOSIS — I43 Cardiomyopathy in diseases classified elsewhere: Secondary | ICD-10-CM | POA: Diagnosis not present

## 2024-05-06 DIAGNOSIS — E119 Type 2 diabetes mellitus without complications: Secondary | ICD-10-CM

## 2024-05-06 DIAGNOSIS — Z794 Long term (current) use of insulin: Secondary | ICD-10-CM

## 2024-05-06 DIAGNOSIS — E039 Hypothyroidism, unspecified: Secondary | ICD-10-CM

## 2024-05-06 LAB — POCT CBG (FASTING - GLUCOSE)-MANUAL ENTRY: Glucose Fasting, POC: 109 mg/dL — AB (ref 70–99)

## 2024-05-06 NOTE — H&P (View-Only) (Signed)
 Cardiology Office Note  Date:  05/09/2024   ID:  Jorge [facility], DOB [birth year], MRN [id number]  PCP:  Fernand Fredy RAMAN, MD   Chief Complaint  Patient presents with   New Patient (Initial Visit)    Self referral to establish care for CAD/CABG x 4 at [facility] on 09/03/2023. Patient c/o shortness of breath and chest pain/tightness.     HPI:  Jorge Gregory is a 69 y.o. male with past medical history of: Past Medical History:  Diagnosis Date   Coronary artery disease    Diabetes mellitus without complication (HCC)    Hep C w/ coma, chronic    Hyperlipidemia   CAD,  CABG x3 (LIMA to LAD, SVG to PDA, SVG to OM) on 09/03/23  Who presents by referral from Dr. Fredy Fernand for coronary artery disease  On discussion today, we reviewed his past cardiac history In 2003 he developed recurrent chest pain symptoms while pushing his wife in a wheelchair up a hill  He bought electric wheel chair, jaw pain resolved  He had recurrence of his symptoms, was told by primary care, Dr. Clarissa, that he had dental issues  Reports that he changed primary cares, seen by Dr. Merriam underwent stress testing that was positive Referred to cardiologist Dr. Fernand: Performed additional testing including cardiac CTA: told he had nonobstructive disease Given persistence of his symptoms jaw pain and dizziness, he was taken for cardiac catheterization July 30, 2023 showing three-vessel coronary disease  Referred to [facility] for CABG Sep 03, 2023  Reports after surgery he recovered well, Since then has been developing SOB on walking, has to stop to recover Periodic chest discomfort Symptoms come on with exertion such as shopping has to stop, develops extreme fatigue Has found symptoms very limiting  Lab work reviewed A1c 6.5 down from 8.5 Total cholesterol 110 LDL 47  Echocardiogram April 04, 2024 Normal left and right ventricular function  EKG personally reviewed by myself on todays visit EKG  Interpretation Date/Time:  Monday May 09 2024 13:58:01 EST Ventricular Rate:  62 PR Interval:  172 QRS Duration:  108 QT Interval:  416 QTC Calculation: 422 R Axis:   -7  Text Interpretation: Normal sinus rhythm Possible Left atrial enlargement When compared with ECG of 06-Aug-2023 10:33, T wave abnormality has improved Confirmed by Perla Lye [phone]) on 05/09/2024 6:35:41 PM    PMH:   has a past medical history of Coronary artery disease, Diabetes mellitus without complication (HCC), Hep C w/ coma, chronic, and Hyperlipidemia.  PSH:    Past Surgical History:  Procedure Laterality Date   CHOLECYSTECTOMY     CORONARY ARTERY BYPASS GRAFT  08/2023   CABG x 4 @ [facility]   LEFT HEART CATH AND CORONARY ANGIOGRAPHY Left 08/06/2023   Procedure: LEFT HEART CATH AND CORONARY ANGIOGRAPHY with possible coronary intervention;  Surgeon: Fernand Denyse DELENA, MD;  Location: [facility] INVASIVE CV LAB;  Service: Cardiovascular;  Laterality: Left;    Current Outpatient Medications  Medication Sig Dispense Refill   aspirin  EC 81 MG tablet Take 1 tablet (81 mg total) by mouth daily. Swallow whole. 30 tablet 12   cetirizine (ZYRTEC) 10 MG tablet TAKE ONE TABLET BY MOUTH ONE TIME DAILY AS NEEDED 90 tablet 2   insulin glargine , 2 Unit Dial, (TOUJEO  MAX SOLOSTAR) 300 UNIT/ML Solostar Pen Inject 25 Units into the skin daily.     isosorbide  mononitrate (IMDUR ) 30 MG 24 hr tablet Take 1 tablet (30 mg total) by mouth daily.  30 tablet 1   magnesium 30 MG tablet Take 30 mg by mouth once.     metoprolol  succinate (TOPROL  XL) 25 MG 24 hr tablet Take 1 tablet (25 mg total) by mouth daily. 30 tablet 3   Multiple Vitamins-Minerals (MENS MULTIVITAMIN PLUS PO) Take 1 capsule by mouth daily.     naproxen (NAPROSYN) 250 MG tablet Take 250 mg by mouth 2 (two) times daily with a meal.     nitroGLYCERIN  (NITROSTAT ) 0.4 MG SL tablet Place 1 tablet (0.4 mg total) under the tongue every 5 (five) minutes as needed for chest pain. 100  tablet 3   sitaGLIPtin -metformin  (JANUMET ) 50-1000 MG tablet Take 1 tablet by mouth 2 (two) times daily with a meal.     Vitamin D, Ergocalciferol, (DRISDOL) 1.25 MG (50000 UNIT) CAPS capsule Take 50,000 Units by mouth every 7 (seven) days.     rosuvastatin  (CRESTOR ) 40 MG tablet Take 1 tablet (40 mg total) by mouth daily. 90 tablet 3   No current facility-administered medications for this visit.    Allergies:   Fluoxetine hcl, Percocet [oxycodone -acetaminophen ], and Prednisone   Social History:  The patient  reports that he has never smoked. He has never used smokeless tobacco. He reports current drug use. Drug: Marijuana. He reports that he does not drink alcohol.   Family History:   family history includes Heart attack in his mother; Heart attack (age of onset: 7) in his father; Heart disease in his mother; Hyperlipidemia in his mother; Hypertension in his mother; Kidney failure in his sister; Stroke in his mother.    Review of Systems: Review of Systems  Constitutional: Negative.   HENT: Negative.    Respiratory:  Positive for shortness of breath.   Cardiovascular:  Positive for chest pain.  Gastrointestinal: Negative.   Musculoskeletal: Negative.   Neurological: Negative.   Psychiatric/Behavioral: Negative.    All other systems reviewed and are negative.  PHYSICAL EXAM: VS:  BP (!) 140/60 (BP Location: Right Arm, Patient Position: Sitting, Cuff Size: Normal)   Pulse 62   Ht 5' 9 (1.753 m)   Wt 181 lb 6 oz (82.3 kg)   SpO2 97%   BMI 26.78 kg/m  , BMI Body mass index is 26.78 kg/m. GEN: Well nourished, well developed, in no acute distress HEENT: normal Neck: no JVD, carotid bruits, or masses Cardiac: RRR; no murmurs, rubs, or gallops,no edema  Respiratory:  clear to auscultation bilaterally, normal work of breathing GI: soft, nontender, nondistended, + BS MS: no deformity or atrophy Skin: warm and dry, no rash Neuro:  Strength and sensation are intact Psych: euthymic  mood, full affect  Recent Labs: 05/06/2024: ALT 19; BUN 28; Creatinine, Ser 1.00; Hemoglobin 14.7; Platelets 184; Potassium 5.2; Sodium 140; TSH 2.950    Lipid Panel Lab Results  Component Value Date   CHOL 139 05/06/2024   HDL 47 05/06/2024   LDLCALC 70 05/06/2024   TRIG 124 05/06/2024      Wt Readings from Last 3 Encounters:  05/09/24 181 lb 6 oz (82.3 kg)  05/06/24 180 lb (81.6 kg)  05/05/24 179 lb (81.2 kg)       ASSESSMENT AND PLAN:  Problem List Items Addressed This Visit       Cardiology Problems   Hypertension associated with diabetes (HCC)   Relevant Medications   insulin glargine , 2 Unit Dial, (TOUJEO  MAX SOLOSTAR) 300 UNIT/ML Solostar Pen   rosuvastatin  (CRESTOR ) 40 MG tablet   Other Relevant Orders   EKG  12-Lead (Completed)   Combined hyperlipidemia associated with type 2 diabetes mellitus (HCC)   Relevant Medications   insulin glargine , 2 Unit Dial, (TOUJEO  MAX SOLOSTAR) 300 UNIT/ML Solostar Pen   rosuvastatin  (CRESTOR ) 40 MG tablet   Mixed hyperlipidemia   Relevant Medications   rosuvastatin  (CRESTOR ) 40 MG tablet   Cardiomyopathy due to hypertension, with heart failure (HCC)   Relevant Medications   rosuvastatin  (CRESTOR ) 40 MG tablet   Coronary artery disease involving native coronary artery of native heart without angina pectoris - Primary   Relevant Medications   rosuvastatin  (CRESTOR ) 40 MG tablet   Other Relevant Orders   EKG 12-Lead (Completed)   CHF (congestive heart failure), NYHA class I, chronic, systolic (HCC)   Relevant Medications   rosuvastatin  (CRESTOR ) 40 MG tablet     Other   Other chest pain   Relevant Medications   rosuvastatin  (CRESTOR ) 40 MG tablet   SOB (shortness of breath)   Relevant Medications   rosuvastatin  (CRESTOR ) 40 MG tablet   Other Visit Diagnoses       Primary hypertension       Relevant Medications   rosuvastatin  (CRESTOR ) 40 MG tablet     Shortness of breath       Relevant Medications    rosuvastatin  (CRESTOR ) 40 MG tablet     Coronary artery disease due to calcified coronary lesion       Ca score 187 on ccta with mild CAD,, advise crestor  40, instead of simvistatin 80   Relevant Medications   rosuvastatin  (CRESTOR ) 40 MG tablet      Coronary artery disease with stable angina History of CABG x 22 Aug 2023 at [facility] Initially felt well following surgery subsequently has had worsening shortness of breath, episodes of chest tightness Has to stop his activities for symptom relief Reports having recent stress test at [facility] showing lateral wall ischemia, which is not available for review.  Was seen by Dr. Fernand who recommended medical management -He is concerned that his symptoms are getting worse, concern for underlying ischemia - After long discussion we have recommended he consider left heart catheterization with possible percutaneous intervention - For now suggested he increase isosorbide  up to 30 twice daily - Continue on aspirin , beta-blocker, statin/Crestor  40 daily - He is indicated he would like to talk over his options with his family and call us  back after he makes a decision  Hyperlipidemia Continue Crestor  40 daily  Essential hypertension Suggest to increase isosorbide  up to 30 twice daily  Diabetes type 2 Managed by primary care, on insulin, Janumet    Signed, Velinda Lunger, M.D., Ph.D. [facility] [place], [place] [phone]

## 2024-05-06 NOTE — Progress Notes (Addendum)
 Cardiology Office Note  Date:  05/09/2024   ID:  Jorge Gregory, DOB [birth year], MRN [id number]  PCP:  Jorge Fredy RAMAN, MD   Chief Complaint  Patient presents with   New Patient (Initial Visit)    Self referral to establish care for CAD/CABG x 4 at [facility] on 09/03/2023. Patient c/o shortness of breath and chest pain/tightness.     HPI:  Jorge Gregory is a 69 y.o. male with past medical history of: Past Medical History:  Diagnosis Date   Coronary artery disease    Diabetes mellitus without complication (HCC)    Hep C w/ coma, chronic    Hyperlipidemia   CAD,  CABG x3 (LIMA to LAD, SVG to PDA, SVG to OM) on 09/03/23  Who presents by referral from Jorge Gregory for coronary artery disease  On discussion today, we reviewed his past cardiac history In 2003 he developed recurrent chest pain symptoms while pushing his wife in a wheelchair up a hill  He bought electric wheel chair, jaw pain resolved  He had recurrence of his symptoms, was told by primary care, Dr. Clarissa, that he had dental issues  Reports that he changed primary cares, seen by Dr. Merriam underwent stress testing that was positive Referred to cardiologist Dr. Fernand: Performed additional testing including cardiac CTA: told he had nonobstructive disease Given persistence of his symptoms jaw pain and dizziness, he was taken for cardiac catheterization July 30, 2023 showing three-vessel coronary disease  Referred to [facility] for [facility]G Sep 03, 2023  Reports after surgery he recovered well, Since then has been developing SOB on walking, has to stop to recover Periodic chest discomfort Symptoms come on with exertion such as shopping has to stop, develops extreme fatigue Has found symptoms very limiting  Lab work reviewed A1c 6.5 down from 8.5 Total cholesterol 110 LDL 47  Echocardiogram April 04, 2024 Normal left and right ventricular function  EKG personally reviewed by myself on todays visit EKG  Interpretation Date/Time:  Monday May 09 2024 13:58:01 EST Ventricular Rate:  62 PR Interval:  172 QRS Duration:  108 QT Interval:  416 QTC Calculation: 422 R Axis:   -7  Text Interpretation: Normal sinus rhythm Possible Left atrial enlargement When compared with ECG of 06-Aug-2023 10:33, T wave abnormality has improved Confirmed by Jorge Gregory [phone]) on 05/09/2024 6:35:41 PM    PMH:   has a past medical history of Coronary artery disease, Diabetes mellitus without complication (HCC), Hep C w/ coma, chronic, and Hyperlipidemia.  PSH:    Past Surgical History:  Procedure Laterality Date   CHOLECYSTECTOMY     CORONARY ARTERY BYPASS GRAFT  08/2023   CABG x 4 @ [facility]   LEFT HEART CATH AND CORONARY ANGIOGRAPHY Left 08/06/2023   Procedure: LEFT HEART CATH AND CORONARY ANGIOGRAPHY with possible coronary intervention;  Surgeon: Jorge Denyse DELENA, MD;  Location: [facility] INVASIVE CV LAB;  Service: Cardiovascular;  Laterality: Left;    Current Outpatient Medications  Medication Sig Dispense Refill   aspirin  EC 81 MG tablet Take 1 tablet (81 mg total) by mouth daily. Swallow whole. 30 tablet 12   cetirizine (ZYRTEC) 10 MG tablet TAKE ONE TABLET BY MOUTH ONE TIME DAILY AS NEEDED 90 tablet 2   insulin glargine , 2 Unit Dial, (TOUJEO  MAX SOLOSTAR) 300 UNIT/ML Solostar Pen Inject 25 Units into the skin daily.     isosorbide  mononitrate (IMDUR ) 30 MG 24 hr tablet Take 1 tablet (30 mg total) by mouth daily.  30 tablet 1   magnesium 30 MG tablet Take 30 mg by mouth once.     metoprolol  succinate (TOPROL  XL) 25 MG 24 hr tablet Take 1 tablet (25 mg total) by mouth daily. 30 tablet 3   Multiple Vitamins-Minerals (MENS MULTIVITAMIN PLUS PO) Take 1 capsule by mouth daily.     naproxen (NAPROSYN) 250 MG tablet Take 250 mg by mouth 2 (two) times daily with a meal.     nitroGLYCERIN  (NITROSTAT ) 0.4 MG SL tablet Place 1 tablet (0.4 mg total) under the tongue every 5 (five) minutes as needed for chest pain. 100  tablet 3   sitaGLIPtin -metformin  (JANUMET ) 50-1000 MG tablet Take 1 tablet by mouth 2 (two) times daily with a meal.     Vitamin D, Ergocalciferol, (DRISDOL) 1.25 MG (50000 UNIT) CAPS capsule Take 50,000 Units by mouth every 7 (seven) days.     rosuvastatin  (CRESTOR ) 40 MG tablet Take 1 tablet (40 mg total) by mouth daily. 90 tablet 3   No current facility-administered medications for this visit.    Allergies:   Fluoxetine hcl, Percocet [oxycodone -acetaminophen ], and Prednisone   Social History:  The patient  reports that he has never smoked. He has never used smokeless tobacco. He reports current drug use. Drug: Marijuana. He reports that he does not drink alcohol.   Family History:   family history includes Heart attack in his mother; Heart attack (age of onset: 7) in his father; Heart disease in his mother; Hyperlipidemia in his mother; Hypertension in his mother; Kidney failure in his sister; Stroke in his mother.    Review of Systems: Review of Systems  Constitutional: Negative.   HENT: Negative.    Respiratory:  Positive for shortness of breath.   Cardiovascular:  Positive for chest pain.  Gastrointestinal: Negative.   Musculoskeletal: Negative.   Neurological: Negative.   Psychiatric/Behavioral: Negative.    All other systems reviewed and are negative.  PHYSICAL EXAM: VS:  BP (!) 140/60 (BP Location: Right Arm, Patient Position: Sitting, Cuff Size: Normal)   Pulse 62   Ht 5' 9 (1.753 m)   Wt 181 lb 6 oz (82.3 kg)   SpO2 97%   BMI 26.78 kg/m  , BMI Body mass index is 26.78 kg/m. GEN: Well nourished, well developed, in no acute distress HEENT: normal Neck: no JVD, carotid bruits, or masses Cardiac: RRR; no murmurs, rubs, or gallops,no edema  Respiratory:  clear to auscultation bilaterally, normal work of breathing GI: soft, nontender, nondistended, + BS MS: no deformity or atrophy Skin: warm and dry, no rash Neuro:  Strength and sensation are intact Psych: euthymic  mood, full affect  Recent Labs: 05/06/2024: ALT 19; BUN 28; Creatinine, Ser 1.00; Hemoglobin 14.7; Platelets 184; Potassium 5.2; Sodium 140; TSH 2.950    Lipid Panel Lab Results  Component Value Date   CHOL 139 05/06/2024   HDL 47 05/06/2024   LDLCALC 70 05/06/2024   TRIG 124 05/06/2024      Wt Readings from Last 3 Encounters:  05/09/24 181 lb 6 oz (82.3 kg)  05/06/24 180 lb (81.6 kg)  05/05/24 179 lb (81.2 kg)       ASSESSMENT AND PLAN:  Problem List Items Addressed This Visit       Cardiology Problems   Hypertension associated with diabetes (HCC)   Relevant Medications   insulin glargine , 2 Unit Dial, (TOUJEO  MAX SOLOSTAR) 300 UNIT/ML Solostar Pen   rosuvastatin  (CRESTOR ) 40 MG tablet   Other Relevant Orders   EKG  12-Lead (Completed)   Combined hyperlipidemia associated with type 2 diabetes mellitus (HCC)   Relevant Medications   insulin glargine , 2 Unit Dial, (TOUJEO  MAX SOLOSTAR) 300 UNIT/ML Solostar Pen   rosuvastatin  (CRESTOR ) 40 MG tablet   Mixed hyperlipidemia   Relevant Medications   rosuvastatin  (CRESTOR ) 40 MG tablet   Cardiomyopathy due to hypertension, with heart failure (HCC)   Relevant Medications   rosuvastatin  (CRESTOR ) 40 MG tablet   Coronary artery disease involving native coronary artery of native heart without angina pectoris - Primary   Relevant Medications   rosuvastatin  (CRESTOR ) 40 MG tablet   Other Relevant Orders   EKG 12-Lead (Completed)   CHF (congestive heart failure), NYHA class I, chronic, systolic (HCC)   Relevant Medications   rosuvastatin  (CRESTOR ) 40 MG tablet     Other   Other chest pain   Relevant Medications   rosuvastatin  (CRESTOR ) 40 MG tablet   SOB (shortness of breath)   Relevant Medications   rosuvastatin  (CRESTOR ) 40 MG tablet   Other Visit Diagnoses       Primary hypertension       Relevant Medications   rosuvastatin  (CRESTOR ) 40 MG tablet     Shortness of breath       Relevant Medications    rosuvastatin  (CRESTOR ) 40 MG tablet     Coronary artery disease due to calcified coronary lesion       Ca score 187 on ccta with mild CAD,, advise crestor  40, instead of simvistatin 80   Relevant Medications   rosuvastatin  (CRESTOR ) 40 MG tablet      Coronary artery disease with stable angina History of CABG x 22 Aug 2023 at [facility] Initially felt well following surgery subsequently has had worsening shortness of breath, episodes of chest tightness Has to stop his activities for symptom relief Reports having recent stress test at [facility] showing lateral wall ischemia, which is not available for review.  Was seen by Dr. Fernand who recommended medical management -He is concerned that his symptoms are getting worse, concern for underlying ischemia - After long discussion we have recommended he consider left heart catheterization with possible percutaneous intervention - For now suggested he increase isosorbide  up to 30 twice daily - Continue on aspirin , beta-blocker, statin/Crestor  40 daily - He is indicated he would like to talk over his options with his family and call us  back after he makes a decision  Hyperlipidemia Continue Crestor  40 daily  Essential hypertension Suggest to increase isosorbide  up to 30 twice daily  Diabetes type 2 Managed by primary care, on insulin, Janumet    Signed, Jorge Gregory, M.D., Ph.D. [facility] [place], [place] [phone]

## 2024-05-06 NOTE — Progress Notes (Signed)
 "  Established Patient Office Visit  Subjective:  Patient ID: Jorge Gregory, male    DOB: 26-Sep-1955  Age: 69 y.o. MRN: [id number]  Chief Complaint  Patient presents with   Follow-up    3 month follow up    Patient comes in for his follow-up today.  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  He denies chest pain, no palpitations, no headaches or dizziness.  He does have numbness over his left chest wall since the surgery.  Patient does not smoke, denies a cough, no sneezing and no postnasal drip.  He recently had cardiac workup with a 2D echo and stress test-  reversible defect in LCX and borderline LVEF at 50%, but Global ventricular systolic function is Normal.  It has been decided to continue medical management for now.  Although his blood pressure is elevated today, it is usually under control.  He takes his medications at bedtime.  He is not taking losartan . Patient reports that since his surgery he has not needed Toujeo  insulin and is controlling his blood sugars with diet control and Janumet  only. Denies symptoms of heartburn, no nausea no vomiting and no diarrhea or constipation.  No melena or blood per rectum.  He takes an aspirin  daily.    No other concerns at this time.   Past Medical History:  Diagnosis Date   Diabetes mellitus without complication (HCC)    Hep C w/ coma, chronic     Past Surgical History:  Procedure Laterality Date   CHOLECYSTECTOMY     LEFT HEART CATH AND CORONARY ANGIOGRAPHY Left 08/06/2023   Procedure: LEFT HEART CATH AND CORONARY ANGIOGRAPHY with possible coronary intervention;  Surgeon: Fernand Denyse DELENA, MD;  Location: [facility] INVASIVE CV LAB;  Service: Cardiovascular;  Laterality: Left;    Social History   Socioeconomic History   Marital status: Widowed    Spouse name: Not on file   Number of children: Not on file   Years of education: Not on file   Highest  education level: Not on file  Occupational History   Not on file  Tobacco Use   Smoking status: Never   Smokeless tobacco: Never  Substance and Sexual Activity   Alcohol use: No   Drug use: Not on file   Sexual activity: Not on file  Other Topics Concern   Not on file  Social History Narrative   Not on file   Social Drivers of Health   Tobacco Use: Low Risk (05/06/2024)   Patient History    Smoking Tobacco Use: Never    Smokeless Tobacco Use: Never    Passive Exposure: Not on file  Financial Resource Strain: Low Risk  (09/09/2023)   [facility]   Overall Financial Resource Strain (CARDIA)    Difficulty of Paying Living Expenses: Not very hard  Food Insecurity: No Food Insecurity (09/09/2023)   Received from [facility]   Epic    Within the past 12 months, you worried that your food would run out before you got the money to buy more.: Never true    Within the past 12 months, the food you bought just didn't last and you didn't have money to get more.: Never true  Transportation Needs: No Transportation Needs (09/09/2023)   Received from [facility] - Transportation    In the past  12 months, has lack of transportation kept you from medical appointments or from getting medications?: No    Lack of Transportation (Non-Medical): No  Physical Activity: Not on file  Stress: Not on file  Social Connections: Not on file  Intimate Partner Violence: Not on file  Depression (PHQ2-9): Low Risk (12/22/2023)   Depression (PHQ2-9)    PHQ-2 Score: 0  Alcohol Screen: Not on file  Housing: Low Risk  (09/02/2023)   [facility]   Epic    In the last 12 months, was there a time when you were not able to pay the mortgage or rent on time?: No    In the past 12 months, how many times have you moved where you were living?: 0    At any time in the past 12 months, were you homeless or living in a  shelter (including now)?: No  Utilities: Not At Risk (09/02/2023)   Received from [facility]    Threatened with loss of utilities: No  Health Literacy: Not on file    History reviewed. No pertinent family history.  Allergies[1]  Show/hide medication list[2]  Review of Systems  Constitutional: Negative.  Negative for chills, fever and malaise/fatigue.  HENT: Negative.  Negative for congestion and sore throat.   Eyes: Negative.  Negative for blurred vision and pain.  Respiratory:  Positive for shortness of breath. Negative for cough.   Cardiovascular: Negative.  Negative for chest pain, palpitations and leg swelling.  Gastrointestinal: Negative.  Negative for abdominal pain, blood in stool, constipation, diarrhea, heartburn, melena, nausea and vomiting.  Genitourinary: Negative.  Negative for dysuria, flank pain, frequency and urgency.  Musculoskeletal: Negative.  Negative for joint pain and myalgias.  Skin: Negative.   Neurological: Negative.  Negative for dizziness, tingling, sensory change, weakness and headaches.  Endo/Heme/Allergies: Negative.   Psychiatric/Behavioral: Negative.  Negative for depression and suicidal ideas. The patient is not nervous/anxious.        Objective:   BP (!) 150/80   Pulse 73   Ht 5' 9 (1.753 m)   Wt 180 lb (81.6 kg)   SpO2 96%   BMI 26.58 kg/m   Vitals:   05/06/24 1013  BP: (!) 150/80  Pulse: 73  Height: 5' 9 (1.753 m)  Weight: 180 lb (81.6 kg)  SpO2: 96%  BMI (Calculated): 26.57    Physical Exam Vitals and nursing note reviewed.  Constitutional:      General: He is not in acute distress.    Appearance: Normal appearance. He is not ill-appearing.  HENT:     Head: Normocephalic and atraumatic.     Nose: Nose normal.     Mouth/Throat:     Mouth: Mucous membranes are moist.     Pharynx: Oropharynx is clear.  Eyes:     Conjunctiva/sclera: Conjunctivae normal.     Pupils: Pupils are equal,  round, and reactive to light.  Cardiovascular:     Rate and Rhythm: Normal rate and regular rhythm.     Pulses: Normal pulses.     Heart sounds: Normal heart sounds.  Pulmonary:     Effort: Pulmonary effort is normal.     Breath sounds: Normal breath sounds. No wheezing or rhonchi.  Abdominal:     General: Bowel sounds are normal. There is no distension.     Palpations: Abdomen is soft.     Tenderness: There is no abdominal tenderness.  Musculoskeletal:  General: Normal range of motion.     Cervical back: Normal range of motion and neck supple.     Right lower leg: No edema.     Left lower leg: No edema.  Skin:    General: Skin is warm and dry.     Capillary Refill: Capillary refill takes less than 2 seconds.  Neurological:     General: No focal deficit present.     Mental Status: He is alert and oriented to person, place, and time.     Sensory: No sensory deficit.     Motor: No weakness.  Psychiatric:        Mood and Affect: Mood normal.        Behavior: Behavior normal.        Judgment: Judgment normal.      Results for orders placed or performed in visit on 05/06/24  POCT CBG (Fasting - Glucose)  Result Value Ref Range   Glucose Fasting, POC 109 (A) 70 - 99 mg/dL    Recent Results (from the past 2160 hours)  POCT CBG (Fasting - Glucose)     Status: Abnormal   Collection Time: 05/06/24 10:17 AM  Result Value Ref Range   Glucose Fasting, POC 109 (A) 70 - 99 mg/dL      Assessment & Plan:  Chest x-ray today.  May consider PFTs. Check labs.  Patient to return in 10 days to discuss and adjust medications. Problem List Items Addressed This Visit       Cardiovascular and Mediastinum   Cardiomyopathy due to hypertension, with heart failure (HCC)   Relevant Orders   CMP14+EGFR   CBC with Diff     Endocrine   Hypothyroidism   Relevant Orders   TSH+T4F+T3Free   Combined hyperlipidemia associated with type 2 diabetes mellitus (HCC)   Relevant Medications    sitaGLIPtin -metformin  (JANUMET ) 50-1000 MG tablet   Other Relevant Orders   Lipid Panel w/o Chol/HDL Ratio   Type 2 diabetes mellitus without complication, with long-term current use of insulin (HCC) - Primary   Relevant Medications   sitaGLIPtin -metformin  (JANUMET ) 50-1000 MG tablet   Other Relevant Orders   POCT CBG (Fasting - Glucose) (Completed)   Hemoglobin A1c   Other Visit Diagnoses       Prostate cancer screening         Shortness of breath       Relevant Orders   DG Chest 2 View     Screening for prostate cancer       Relevant Orders   PSA       Return in about 1 week (around 05/13/2024).   Total time spent: 30 minutes. This time includes review of previous notes and results and patient face to face interaction during today's visit.    FERNAND FREDY RAMAN, MD  05/06/2024   This document may have been prepared by [facility] Voice Recognition software and as such may include unintentional dictation errors.     [1]  Allergies Allergen Reactions   Fluoxetine Hcl     Prozac - causes Headache    Percocet [Oxycodone -Acetaminophen ] Other (See Comments)    hallucinations   Prednisone     Dizziness, BP  and BS elevated  [2]  Outpatient Medications Prior to Visit  Medication Sig   aspirin  EC 81 MG tablet Take 1 tablet (81 mg total) by mouth daily. Swallow whole.   cetirizine (ZYRTEC) 10 MG tablet TAKE ONE TABLET BY MOUTH ONE TIME DAILY AS NEEDED   isosorbide   mononitrate (IMDUR ) 30 MG 24 hr tablet Take 1 tablet (30 mg total) by mouth daily.   magnesium 30 MG tablet Take 30 mg by mouth once.   metoprolol  succinate (TOPROL  XL) 25 MG 24 hr tablet Take 1 tablet (25 mg total) by mouth daily.   Multiple Vitamins-Minerals (MENS MULTIVITAMIN PLUS PO) Take 1 capsule by mouth daily.   naproxen (NAPROSYN) 250 MG tablet Take 250 mg by mouth 2 (two) times daily with a meal.   nitroGLYCERIN  (NITROSTAT ) 0.4 MG SL tablet Place 1 tablet (0.4 mg total) under the tongue every 5 (five) minutes  as needed for chest pain.   rosuvastatin  (CRESTOR ) 40 MG tablet TAKE 1 TABLET BY MOUTH ONCE DAILY.   sitaGLIPtin -metformin  (JANUMET ) 50-1000 MG tablet Take 1 tablet by mouth 2 (two) times daily with a meal.   losartan  (COZAAR ) 50 MG tablet Take 1 tablet (50 mg total) by mouth 2 (two) times daily. (Patient not taking: Reported on 05/06/2024)   [DISCONTINUED] insulin glargine , 2 Unit Dial, (TOUJEO  MAX SOLOSTAR) 300 UNIT/ML Solostar Pen Inject 25 Units into the skin in the morning and at bedtime. (Patient not taking: Reported on 05/06/2024)   No facility-administered medications prior to visit.   "

## 2024-05-07 LAB — CMP14+EGFR
ALT: 19 IU/L (ref 0–44)
AST: 22 IU/L (ref 0–40)
Albumin: 5 g/dL — ABNORMAL HIGH (ref 3.9–4.9)
Alkaline Phosphatase: 63 IU/L (ref 47–123)
BUN/Creatinine Ratio: 28 — ABNORMAL HIGH (ref 10–24)
BUN: 28 mg/dL — ABNORMAL HIGH (ref 8–27)
Bilirubin Total: 0.4 mg/dL (ref 0.0–1.2)
CO2: 20 mmol/L (ref 20–29)
Calcium: 10.1 mg/dL (ref 8.6–10.2)
Chloride: 105 mmol/L (ref 96–106)
Creatinine, Ser: 1 mg/dL (ref 0.76–1.27)
Globulin, Total: 3.1 g/dL (ref 1.5–4.5)
Glucose: 120 mg/dL — ABNORMAL HIGH (ref 70–99)
Potassium: 5.2 mmol/L (ref 3.5–5.2)
Sodium: 140 mmol/L (ref 134–144)
Total Protein: 8.1 g/dL (ref 6.0–8.5)
eGFR: 82 mL/min/1.73

## 2024-05-07 LAB — CBC WITH DIFFERENTIAL/PLATELET
Basophils Absolute: 0.1 x10E3/uL (ref 0.0–0.2)
Basos: 1 %
EOS (ABSOLUTE): 0.4 x10E3/uL (ref 0.0–0.4)
Eos: 5 %
Hematocrit: 43.8 % (ref 37.5–51.0)
Hemoglobin: 14.7 g/dL (ref 13.0–17.7)
Immature Grans (Abs): 0 x10E3/uL (ref 0.0–0.1)
Immature Granulocytes: 0 %
Lymphocytes Absolute: 2.3 x10E3/uL (ref 0.7–3.1)
Lymphs: 29 %
MCH: 32.1 pg (ref 26.6–33.0)
MCHC: 33.6 g/dL (ref 31.5–35.7)
MCV: 96 fL (ref 79–97)
Monocytes Absolute: 0.7 x10E3/uL (ref 0.1–0.9)
Monocytes: 8 %
Neutrophils Absolute: 4.5 x10E3/uL (ref 1.4–7.0)
Neutrophils: 57 %
Platelets: 184 x10E3/uL (ref 150–450)
RBC: 4.58 x10E6/uL (ref 4.14–5.80)
RDW: 12.8 % (ref 11.6–15.4)
WBC: 7.9 x10E3/uL (ref 3.4–10.8)

## 2024-05-07 LAB — LIPID PANEL W/O CHOL/HDL RATIO
Cholesterol, Total: 139 mg/dL (ref 100–199)
HDL: 47 mg/dL
LDL Chol Calc (NIH): 70 mg/dL (ref 0–99)
Triglycerides: 124 mg/dL (ref 0–149)
VLDL Cholesterol Cal: 22 mg/dL (ref 5–40)

## 2024-05-07 LAB — TSH+T4F+T3FREE
Free T4: 0.98 ng/dL (ref 0.82–1.77)
T3, Free: 3.1 pg/mL (ref 2.0–4.4)
TSH: 2.95 u[IU]/mL (ref 0.450–4.500)

## 2024-05-07 LAB — HEMOGLOBIN A1C
Est. average glucose Bld gHb Est-mCnc: 137 mg/dL
Hgb A1c MFr Bld: 6.4 % — ABNORMAL HIGH (ref 4.8–5.6)

## 2024-05-07 LAB — PSA: Prostate Specific Ag, Serum: 2.2 ng/mL (ref 0.0–4.0)

## 2024-05-09 ENCOUNTER — Ambulatory Visit: Payer: Self-pay | Admitting: Internal Medicine

## 2024-05-09 ENCOUNTER — Encounter: Payer: Self-pay | Admitting: Cardiovascular Disease

## 2024-05-09 ENCOUNTER — Ambulatory Visit: Attending: Cardiovascular Disease | Admitting: Cardiovascular Disease

## 2024-05-09 VITALS — BP 140/60 | HR 62 | Ht 69.0 in | Wt 181.4 lb

## 2024-05-09 DIAGNOSIS — R0789 Other chest pain: Secondary | ICD-10-CM

## 2024-05-09 DIAGNOSIS — I10 Essential (primary) hypertension: Secondary | ICD-10-CM | POA: Diagnosis not present

## 2024-05-09 DIAGNOSIS — I2510 Atherosclerotic heart disease of native coronary artery without angina pectoris: Secondary | ICD-10-CM

## 2024-05-09 DIAGNOSIS — E1169 Type 2 diabetes mellitus with other specified complication: Secondary | ICD-10-CM | POA: Diagnosis not present

## 2024-05-09 DIAGNOSIS — I5022 Chronic systolic (congestive) heart failure: Secondary | ICD-10-CM

## 2024-05-09 DIAGNOSIS — I152 Hypertension secondary to endocrine disorders: Secondary | ICD-10-CM | POA: Diagnosis not present

## 2024-05-09 DIAGNOSIS — I110 Hypertensive heart disease with heart failure: Secondary | ICD-10-CM | POA: Diagnosis not present

## 2024-05-09 DIAGNOSIS — R0602 Shortness of breath: Secondary | ICD-10-CM | POA: Diagnosis not present

## 2024-05-09 DIAGNOSIS — I2584 Coronary atherosclerosis due to calcified coronary lesion: Secondary | ICD-10-CM

## 2024-05-09 DIAGNOSIS — E1159 Type 2 diabetes mellitus with other circulatory complications: Secondary | ICD-10-CM | POA: Diagnosis not present

## 2024-05-09 DIAGNOSIS — E782 Mixed hyperlipidemia: Secondary | ICD-10-CM

## 2024-05-09 DIAGNOSIS — I43 Cardiomyopathy in diseases classified elsewhere: Secondary | ICD-10-CM | POA: Diagnosis not present

## 2024-05-09 MED ORDER — ROSUVASTATIN CALCIUM 40 MG PO TABS: 40.0000 mg | ORAL_TABLET | Freq: Every day | ORAL | 3 refills | Status: AC

## 2024-05-09 NOTE — Patient Instructions (Addendum)
 Please call if you like a cardiac cath   Medication Instructions:  Please do a trial increase of the isosorbide  up to 30 mg twice a day  If you need a refill on your cardiac medications before your next appointment, please call your pharmacy.   Lab work: No new labs needed  Testing/Procedures: No new testing needed  Follow-Up: At [facility], you and your health needs are our priority.  As part of our continuing mission to provide you with exceptional heart care, we have created designated Provider Care Teams.  These Care Teams include your primary Cardiologist (physician) and Advanced Practice Providers (APPs -  Physician Assistants and Nurse Practitioners) who all work together to provide you with the care you need, when you need it.  You will need a follow up appointment as needed  Providers on your designated Care Team:   Lonni Meager, NP Bernardino Bring, PA-C [place]ence [place], [place]  COVID-19 Vaccine Information can be found at: [email or web address] For questions related to vaccine distribution or appointments, please email [email or web address] or call [phone].

## 2024-05-10 ENCOUNTER — Other Ambulatory Visit: Payer: Self-pay | Admitting: Emergency Medicine

## 2024-05-10 ENCOUNTER — Encounter: Payer: Self-pay | Admitting: Cardiovascular Disease

## 2024-05-10 DIAGNOSIS — Z79899 Other long term (current) drug therapy: Secondary | ICD-10-CM

## 2024-05-11 ENCOUNTER — Telehealth: Payer: Self-pay | Admitting: Cardiovascular Disease

## 2024-05-11 NOTE — Progress Notes (Signed)
 Patient notified

## 2024-05-11 NOTE — Telephone Encounter (Signed)
 The patient was transfer to me from [facility].  Stephania attempted to tell me him that the Nurse had called him this morning to reschedule his procedure.  He begin to cuss at her and call her a lair. She tried to tell him she would get him over to nurse that attempted to contact her and he said no.  He wanted to speak to a Supervisor. When he was transferred, he started in cussing at me and would not even let me speak.  He Told me that we were Fucking lair and [employer] over there.  He stated every time I go to that hospial of office I have deal with a bunch of Fucking Idiots  He continued to cuss at me. I attempted to speak and try help him get over to Jon to get his procedure rescheduled but he would not stop cussing at me long enough to hear anything.  He was very armed forces technical officer.   He ended up disconnecting the call.

## 2024-05-11 NOTE — Telephone Encounter (Signed)
 Pt needs to r/s cath Procedure.

## 2024-05-16 ENCOUNTER — Other Ambulatory Visit: Payer: Self-pay | Admitting: Cardiovascular Disease

## 2024-05-16 DIAGNOSIS — I200 Unstable angina: Secondary | ICD-10-CM

## 2024-05-19 ENCOUNTER — Ambulatory Visit: Admitting: Internal Medicine

## 2024-05-19 ENCOUNTER — Encounter: Payer: Self-pay | Admitting: Internal Medicine

## 2024-05-19 VITALS — BP 136/66 | HR 76 | Ht 69.0 in | Wt 182.4 lb

## 2024-05-19 DIAGNOSIS — E782 Mixed hyperlipidemia: Secondary | ICD-10-CM

## 2024-05-19 DIAGNOSIS — E1165 Type 2 diabetes mellitus with hyperglycemia: Secondary | ICD-10-CM

## 2024-05-19 DIAGNOSIS — Z794 Long term (current) use of insulin: Secondary | ICD-10-CM

## 2024-05-19 DIAGNOSIS — E039 Hypothyroidism, unspecified: Secondary | ICD-10-CM | POA: Diagnosis not present

## 2024-05-19 DIAGNOSIS — Z951 Presence of aortocoronary bypass graft: Secondary | ICD-10-CM

## 2024-05-19 DIAGNOSIS — E1169 Type 2 diabetes mellitus with other specified complication: Secondary | ICD-10-CM

## 2024-05-19 DIAGNOSIS — E119 Type 2 diabetes mellitus without complications: Secondary | ICD-10-CM

## 2024-05-19 DIAGNOSIS — R0789 Other chest pain: Secondary | ICD-10-CM | POA: Diagnosis not present

## 2024-05-19 DIAGNOSIS — R0602 Shortness of breath: Secondary | ICD-10-CM | POA: Diagnosis not present

## 2024-05-19 DIAGNOSIS — Z0130 Encounter for examination of blood pressure without abnormal findings: Secondary | ICD-10-CM

## 2024-05-19 LAB — POCT CBG (FASTING - GLUCOSE)-MANUAL ENTRY: Glucose Fasting, POC: 217 mg/dL — AB (ref 70–99)

## 2024-05-19 NOTE — Progress Notes (Signed)
 "  Established Patient Office Visit  Subjective:  Patient ID: Jorge Gregory, male    DOB: [birth year]  Age: 69 y.o. MRN: [id number]  Chief Complaint  Patient presents with   Follow-up    10 day follow up    Patient comes in for his follow-up of persistent shortness of breath and chest pain status post CABG in May 2025. A chest x-ray was done at his last visit which showed lungs to be clear and visualized skeletal structures were unremarkable.  O2 saturation is 96% at room air. Since then patient has been evaluated by another cardiologist, Dr. Gollan, and due to persistence of chest pain and dyspnea with exertion, a cardiac catheterization has been scheduled.  His dose of Imdur  was increased to 30 mg twice a day to ease the chest pain, but patient says that it does not help. He has also been complaining of pain and tenderness overlying lower part of his sternum, with a clicking sound.  This has been going on since his surgery last year.  He is currently taking naproxen tablets twice a day with food.  He is worried about the possibility of a loose wire. May need a trial of gabapentin for chronic post sternotomy pain syndrome or a surgical evaluation for wire removal. Will address this after his cardiac cath. Patient denies heartburn, no nausea or vomiting, no history of anxiety. Labs were done recently, results discussed today.    No other concerns at this time.   Past Medical History:  Diagnosis Date   Coronary artery disease    Diabetes mellitus without complication (HCC)    Hep C w/ coma, chronic    Hyperlipidemia     Past Surgical History:  Procedure Laterality Date   CHOLECYSTECTOMY     CORONARY ARTERY BYPASS GRAFT  08/2023   CABG x 4 @ [facility]   LEFT HEART CATH AND CORONARY ANGIOGRAPHY Left 08/06/2023   Procedure: LEFT HEART CATH AND CORONARY ANGIOGRAPHY with possible coronary intervention;  Surgeon: Fernand Denyse DELENA, MD;  Location: [facility] INVASIVE CV LAB;  Service: Cardiovascular;   Laterality: Left;    Social History   Socioeconomic History   Marital status: Widowed    Spouse name: Not on file   Number of children: Not on file   Years of education: Not on file   Highest education level: Not on file  Occupational History   Not on file  Tobacco Use   Smoking status: Never   Smokeless tobacco: Never  Vaping Use   Vaping status: Never Used  Substance and Sexual Activity   Alcohol use: No   Drug use: Yes    Types: Marijuana    Comment: Smoke marijuana daily   Sexual activity: Not on file  Other Topics Concern   Not on file  Social History Narrative   Not on file   Social Drivers of Health   Tobacco Use: Low Risk (05/19/2024)   Patient History    Smoking Tobacco Use: Never    Smokeless Tobacco Use: Never    Passive Exposure: Not on file  Financial Resource Strain: Low Risk  (09/09/2023)   Received from [facility]   Overall Financial Resource Strain (CARDIA)    Difficulty of Paying Living Expenses: Not very hard  Food Insecurity: No Food Insecurity (09/09/2023)   Received from [facility]   Epic    Within the past 12 months, you worried that your food would run out before you got the  money to buy more.: Never true    Within the past 12 months, the food you bought just didn't last and you didn't have money to get more.: Never true  Transportation Needs: No Transportation Needs (09/09/2023)   Received from [facility] - Transportation    In the past 12 months, has lack of transportation kept you from medical appointments or from getting medications?: No    Lack of Transportation (Non-Medical): No  Physical Activity: Not on file  Stress: Not on file  Social Connections: Not on file  Intimate Partner Violence: Not on file  Depression (PHQ2-9): Low Risk (12/22/2023)   Depression (PHQ2-9)    PHQ-2 Score: 0  Alcohol Screen: Not on file  Housing: Low Risk  (09/02/2023)   Received from [facility]   Epic    In the last 12 months, was there a time when you were not able to pay the mortgage or rent on time?: No    In the past 12 months, how many times have you moved where you were living?: 0    At any time in the past 12 months, were you homeless or living in a shelter (including now)?: No  Utilities: Not At Risk (09/02/2023)   Received from [facility] Utilities    Threatened with loss of utilities: No  Health Literacy: Not on file    Family History  Problem Relation Age of Onset   Hyperlipidemia Mother    Heart disease Mother    Heart attack Mother    Hypertension Mother    Stroke Mother    Heart attack Father 54   Kidney failure Sister     Allergies[1]  Show/hide medication list[2]  Review of Systems  Constitutional: Negative.  Negative for chills, fever and malaise/fatigue.  HENT: Negative.  Negative for congestion and sore throat.   Eyes: Negative.  Negative for blurred vision and pain.  Respiratory:  Positive for shortness of breath. Negative for cough.   Cardiovascular:  Positive for chest pain. Negative for palpitations and leg swelling.  Gastrointestinal: Negative.  Negative for abdominal pain, blood in stool, constipation, diarrhea, heartburn, melena, nausea and vomiting.  Genitourinary: Negative.  Negative for dysuria, flank pain, frequency and urgency.  Musculoskeletal: Negative.  Negative for joint pain and myalgias.  Skin: Negative.   Neurological: Negative.  Negative for dizziness, tingling, sensory change, weakness and headaches.  Endo/Heme/Allergies: Negative.   Psychiatric/Behavioral: Negative.  Negative for depression and suicidal ideas. The patient is not nervous/anxious.        Objective:   BP 136/66   Pulse 76   Ht 5' 9 (1.753 m)   Wt 182 lb 6.4 oz (82.7 kg)   SpO2 96%   BMI 26.94 kg/m   Vitals:   05/19/24 1107  BP: 136/66  Pulse: 76  Height: 5' 9 (1.753 m)  Weight: 182 lb 6.4 oz  (82.7 kg)  SpO2: 96%  BMI (Calculated): 26.92    Physical Exam Constitutional:      General: He is in acute distress.  Pulmonary:     Effort: Pulmonary effort is normal.  Musculoskeletal:     Comments: Tenderness over lower sternum and xiphoid-  Neurological:     General: No focal deficit present.     Mental Status: He is oriented to person, place, and time.      Results for orders placed or performed in visit on 05/19/24  POCT CBG (Fasting -  Glucose)  Result Value Ref Range   Glucose Fasting, POC 217 (A) 70 - 99 mg/dL    Recent Results (from the past 2160 hours)  POCT CBG (Fasting - Glucose)     Status: Abnormal   Collection Time: 05/06/24 10:17 AM  Result Value Ref Range   Glucose Fasting, POC 109 (A) 70 - 99 mg/dL  [id number]     Status: Abnormal   Collection Time: 05/06/24 11:04 AM  Result Value Ref Range   Glucose 120 (H) 70 - 99 mg/dL   BUN 28 (H) 8 - 27 mg/dL   Creatinine, Ser 8.99 0.76 - 1.27 mg/dL   eGFR 82 >40 fO/fpw/8.26   BUN/Creatinine Ratio 28 (H) 10 - 24   Sodium 140 134 - 144 mmol/L   Potassium 5.2 3.5 - 5.2 mmol/L   Chloride 105 96 - 106 mmol/L   CO2 20 20 - 29 mmol/L   Calcium  10.1 8.6 - 10.2 mg/dL   Total Protein 8.1 6.0 - 8.5 g/dL   Albumin 5.0 (H) 3.9 - 4.9 g/dL   Globulin, Total 3.1 1.5 - 4.5 g/dL   Bilirubin Total 0.4 0.0 - 1.2 mg/dL   Alkaline Phosphatase 63 47 - 123 IU/L   AST 22 0 - 40 IU/L   ALT 19 0 - 44 IU/L  Lipid Panel w/o Chol/HDL Ratio     Status: None   Collection Time: 05/06/24 11:04 AM  Result Value Ref Range   Cholesterol, Total 139 100 - 199 mg/dL   Triglycerides 875 0 - 149 mg/dL   HDL 47 >60 mg/dL   VLDL Cholesterol Cal 22 5 - 40 mg/dL   LDL Chol Calc (NIH) 70 0 - 99 mg/dL  CBC with Diff     Status: None   Collection Time: 05/06/24 11:04 AM  Result Value Ref Range   WBC 7.9 3.4 - 10.8 x10E3/uL   RBC 4.58 4.14 - 5.80 x10E6/uL   Hemoglobin 14.7 13.0 - 17.7 g/dL   Hematocrit 56.1 62.4 - 51.0 %   MCV 96 79 - 97 fL    MCH 32.1 26.6 - 33.0 pg   MCHC 33.6 31.5 - 35.7 g/dL   RDW 87.1 88.3 - 84.5 %   Platelets 184 150 - 450 x10E3/uL   Neutrophils 57 Not Estab. %   Lymphs 29 Not Estab. %   Monocytes 8 Not Estab. %   Eos 5 Not Estab. %   Basos 1 Not Estab. %   Neutrophils Absolute 4.5 1.4 - 7.0 x10E3/uL   Lymphocytes Absolute 2.3 0.7 - 3.1 x10E3/uL   Monocytes Absolute 0.7 0.1 - 0.9 x10E3/uL   EOS (ABSOLUTE) 0.4 0.0 - 0.4 x10E3/uL   Basophils Absolute 0.1 0.0 - 0.2 x10E3/uL   Immature Granulocytes 0 Not Estab. %   Immature Grans (Abs) 0.0 0.0 - 0.1 x10E3/uL  Hemoglobin A1c     Status: Abnormal   Collection Time: 05/06/24 11:04 AM  Result Value Ref Range   Hgb A1c MFr Bld 6.4 (H) 4.8 - 5.6 %    Comment:          Prediabetes: 5.7 - 6.4          Diabetes: >6.4          Glycemic control for adults with diabetes: <7.0    Est. average glucose Bld gHb Est-mCnc 137 mg/dL  PSA     Status: None   Collection Time: 05/06/24 11:04 AM  Result Value Ref Range   Prostate Specific Ag, Serum  2.2 0.0 - 4.0 ng/mL    Comment: Roche ECLIA methodology. According to the American Urological Association, Serum PSA should decrease and remain at undetectable levels after radical prostatectomy. The AUA defines biochemical recurrence as an initial PSA value 0.2 ng/mL or greater followed by a subsequent confirmatory PSA value 0.2 ng/mL or greater. Values obtained with different assay methods or kits cannot be used interchangeably. Results cannot be interpreted as absolute evidence of the presence or absence of malignant disease.   TSH+T4F+T3Free     Status: None   Collection Time: 05/06/24 11:05 AM  Result Value Ref Range   TSH 2.950 0.450 - 4.500 uIU/mL   T3, Free 3.1 2.0 - 4.4 pg/mL   Free T4 0.98 0.82 - 1.77 ng/dL  POCT CBG (Fasting - Glucose)     Status: Abnormal   Collection Time: 05/19/24 11:11 AM  Result Value Ref Range   Glucose Fasting, POC 217 (A) 70 - 99 mg/dL      Assessment & Plan:  Continue current  medications.  Will discuss medication adjustment after his cardiac cath results are available. May need a surgical referral to evaluate for possible loose post sternotomy wire. Problem List Items Addressed This Visit       Endocrine   Hypothyroidism   Combined hyperlipidemia associated with type 2 diabetes mellitus (HCC)   Type 2 diabetes mellitus without complication, with long-term current use of insulin (HCC) - Primary   Relevant Orders   POCT CBG (Fasting - Glucose) (Completed)     Other   Other chest pain   Shortness of breath   S/P CABG (coronary artery bypass graft)    Return in about 3 months (around 08/17/2024).   Total time spent: 30 minutes. This time includes review of previous notes and results and patient face to face interaction during today's visit.    FERNAND FREDY RAMAN, MD  05/19/2024   This document may have been prepared by [facility] Voice Recognition software and as such may include unintentional dictation errors.     [1]  Allergies Allergen Reactions   Percocet [Oxycodone -Acetaminophen ] Other (See Comments)    hallucinations   Prednisone     Dizziness, BP  and BS elevated   Prozac [Fluoxetine Hcl]     Headache  [2]  Outpatient Medications Prior to Visit  Medication Sig   aspirin  EC 81 MG tablet Take 1 tablet (81 mg total) by mouth daily. Swallow whole.   cetirizine (ZYRTEC) 10 MG tablet TAKE ONE TABLET BY MOUTH ONE TIME DAILY AS NEEDED   insulin glargine , 2 Unit Dial, (TOUJEO  MAX SOLOSTAR) 300 UNIT/ML Solostar Pen Inject 25 Units into the skin daily.   isosorbide  mononitrate (IMDUR ) 30 MG 24 hr tablet Take 1 tablet (30 mg total) by mouth daily.   magnesium 30 MG tablet Take 30 mg by mouth once.   metoprolol  succinate (TOPROL  XL) 25 MG 24 hr tablet Take 1 tablet (25 mg total) by mouth daily.   Multiple Vitamins-Minerals (MENS MULTIVITAMIN PLUS PO) Take 1 capsule by mouth daily.   naproxen (NAPROSYN) 250 MG tablet Take 250 mg by mouth 2 (two) times daily  with a meal.   nitroGLYCERIN  (NITROSTAT ) 0.4 MG SL tablet Place 1 tablet (0.4 mg total) under the tongue every 5 (five) minutes as needed for chest pain.   rosuvastatin  (CRESTOR ) 40 MG tablet Take 1 tablet (40 mg total) by mouth daily.   sitaGLIPtin -metformin  (JANUMET ) 50-1000 MG tablet Take 1 tablet by mouth 2 (two) times daily with a  meal.   Vitamin D, Ergocalciferol, (DRISDOL) 1.25 MG (50000 UNIT) CAPS capsule Take 50,000 Units by mouth every 7 (seven) days.   No facility-administered medications prior to visit.   "

## 2024-05-25 ENCOUNTER — Telehealth: Payer: Self-pay | Admitting: Cardiovascular Disease

## 2024-05-25 ENCOUNTER — Telehealth: Payer: Self-pay | Admitting: *Deleted

## 2024-05-25 NOTE — Telephone Encounter (Signed)
 Patient called, he is requesting a refill on Omeprazole  but did not have his bottle with him & I did not see it on his medication list past or current. He said that it was prescribed by Dr. Fernand. I called [facility] and left a voicemail with them asking that they send the request to us .

## 2024-05-25 NOTE — Telephone Encounter (Signed)
 Cardiac Catheterization scheduled at [facility] for: Friday May 27, 2024 10 AM Arrival time [facility] Entrance at: 9 AM [address] [place] [address]  Diet: -Nothing to eat after midnight.  Hydration: -May drink clear liquids until 2 hours (8 AM) before the procedure.  Approved liquids: Water , clear tea, black coffee, fruit juices-non-citric and without pulp,Gatorade, plain Jello/popsicles.   -Please drink 16 oz of water  2 hours before procedure.  Medication instructions: -Hold:  Insulin-AM of procedure  Janumet -day of procedure and 48 hours after procedure -Other usual morning medications can be taken including aspirin  81 mg.  Plan to go home the same day, you will only stay overnight if medically necessary.  You must have responsible adult to drive you home.  Someone must be with you the first 24 hours after you arrive home.  Reviewed procedure instructions with patient.

## 2024-05-25 NOTE — Telephone Encounter (Signed)
 Created in Error

## 2024-05-26 ENCOUNTER — Other Ambulatory Visit: Payer: Self-pay

## 2024-05-26 MED ORDER — OMEPRAZOLE 40 MG PO CPDR: 40.0000 mg | DELAYED_RELEASE_CAPSULE | Freq: Every day | ORAL | 3 refills | Status: AC

## 2024-05-26 NOTE — Telephone Encounter (Signed)
 Rx sent.

## 2024-05-27 ENCOUNTER — Ambulatory Visit
Admission: RE | Admit: 2024-05-27 | Discharge: 2024-05-27 | Disposition: A | Discharge status: Home / Self Care | Source: Ambulatory Visit | Attending: Internal Medicine | Admitting: Internal Medicine

## 2024-05-27 ENCOUNTER — Encounter
Admission: RE | Disposition: A | Discharge status: Home / Self Care | Payer: Self-pay | Source: Ambulatory Visit | Attending: Internal Medicine

## 2024-05-27 ENCOUNTER — Encounter: Payer: Self-pay | Admitting: Internal Medicine

## 2024-05-27 ENCOUNTER — Other Ambulatory Visit: Payer: Self-pay

## 2024-05-27 DIAGNOSIS — I10 Essential (primary) hypertension: Secondary | ICD-10-CM

## 2024-05-27 DIAGNOSIS — I43 Cardiomyopathy in diseases classified elsewhere: Secondary | ICD-10-CM

## 2024-05-27 DIAGNOSIS — R0602 Shortness of breath: Secondary | ICD-10-CM

## 2024-05-27 DIAGNOSIS — E782 Mixed hyperlipidemia: Secondary | ICD-10-CM

## 2024-05-27 DIAGNOSIS — I2510 Atherosclerotic heart disease of native coronary artery without angina pectoris: Secondary | ICD-10-CM

## 2024-05-27 DIAGNOSIS — I25119 Atherosclerotic heart disease of native coronary artery with unspecified angina pectoris: Secondary | ICD-10-CM | POA: Diagnosis present

## 2024-05-27 DIAGNOSIS — R0789 Other chest pain: Secondary | ICD-10-CM

## 2024-05-27 DIAGNOSIS — R079 Chest pain, unspecified: Secondary | ICD-10-CM

## 2024-05-27 DIAGNOSIS — I200 Unstable angina: Secondary | ICD-10-CM

## 2024-05-27 DIAGNOSIS — I5022 Chronic systolic (congestive) heart failure: Secondary | ICD-10-CM

## 2024-05-27 DIAGNOSIS — R0609 Other forms of dyspnea: Secondary | ICD-10-CM | POA: Diagnosis present

## 2024-05-27 LAB — GLUCOSE, CAPILLARY: Glucose-Capillary: 163 mg/dL — ABNORMAL HIGH (ref 70–99)

## 2024-05-27 MED ORDER — HEPARIN (PORCINE) IN NACL 2000-0.9 UNIT/L-% IV SOLN
INTRAVENOUS | Status: DC | PRN
  Administered 2024-05-27: 1000 mL

## 2024-05-27 MED ORDER — SODIUM CHLORIDE 0.9% FLUSH: 3.0000 mL | Freq: Two times a day (BID) | INTRAVENOUS | Status: DC

## 2024-05-27 MED ORDER — HEPARIN SODIUM (PORCINE) 1000 UNIT/ML IJ SOLN
INTRAMUSCULAR | Status: AC
  Filled 2024-05-27: qty 10

## 2024-05-27 MED ORDER — SODIUM CHLORIDE 0.9 % IV SOLN
250.0000 mL | INTRAVENOUS | Status: DC | PRN
  Administered 2024-05-27: 250 mL via INTRAVENOUS

## 2024-05-27 MED ORDER — FREE WATER: 500.0000 mL | Freq: Once | Status: DC

## 2024-05-27 MED ORDER — METOPROLOL SUCCINATE ER 25 MG PO TB24: 25.0000 mg | ORAL_TABLET | Freq: Every day | ORAL | 3 refills | Status: AC

## 2024-05-27 MED ORDER — HEPARIN SODIUM (PORCINE) 1000 UNIT/ML IJ SOLN
INTRAMUSCULAR | Status: DC | PRN
  Administered 2024-05-27: 4000 [IU] via INTRAVENOUS

## 2024-05-27 MED ORDER — LIDOCAINE HCL 1 % IJ SOLN
INTRAMUSCULAR | Status: AC
  Filled 2024-05-27: qty 20

## 2024-05-27 MED ORDER — MIDAZOLAM HCL (PF) 2 MG/2ML IJ SOLN
INTRAMUSCULAR | Status: DC | PRN
  Administered 2024-05-27: 2 mg via INTRAVENOUS

## 2024-05-27 MED ORDER — LABETALOL HCL 5 MG/ML IV SOLN: 10.0000 mg | INTRAVENOUS | Status: DC | PRN

## 2024-05-27 MED ORDER — ACETAMINOPHEN 325 MG PO TABS: 650.0000 mg | ORAL_TABLET | ORAL | Status: DC | PRN

## 2024-05-27 MED ORDER — SODIUM CHLORIDE 0.9 % IV SOLN: 250.0000 mL | INTRAVENOUS | Status: DC | PRN

## 2024-05-27 MED ORDER — HYDRALAZINE HCL 20 MG/ML IJ SOLN: 10.0000 mg | INTRAMUSCULAR | Status: DC | PRN

## 2024-05-27 MED ORDER — MIDAZOLAM HCL 2 MG/2ML IJ SOLN
INTRAMUSCULAR | Status: AC
  Filled 2024-05-27: qty 2

## 2024-05-27 MED ORDER — ASPIRIN 81 MG PO CHEW: 81.0000 mg | CHEWABLE_TABLET | ORAL | Status: DC

## 2024-05-27 MED ORDER — SODIUM CHLORIDE 0.9% FLUSH: 3.0000 mL | INTRAVENOUS | Status: DC | PRN

## 2024-05-27 MED ORDER — VERAPAMIL HCL 2.5 MG/ML IV SOLN
INTRAVENOUS | Status: AC
  Filled 2024-05-27: qty 2

## 2024-05-27 MED ORDER — ONDANSETRON HCL 4 MG/2ML IJ SOLN: 4.0000 mg | Freq: Four times a day (QID) | INTRAMUSCULAR | Status: DC | PRN

## 2024-05-27 MED ORDER — FENTANYL CITRATE (PF) 100 MCG/2ML IJ SOLN
INTRAMUSCULAR | Status: AC
  Filled 2024-05-27: qty 2

## 2024-05-27 MED ORDER — VERAPAMIL HCL 2.5 MG/ML IV SOLN
INTRAVENOUS | Status: DC | PRN
  Administered 2024-05-27 (×2): 2.5 mg via INTRAVENOUS

## 2024-05-27 MED ORDER — FENTANYL CITRATE (PF) 100 MCG/2ML IJ SOLN
INTRAMUSCULAR | Status: DC | PRN
  Administered 2024-05-27: 25 ug via INTRAVENOUS

## 2024-05-27 MED ORDER — IOHEXOL 300 MG/ML  SOLN
INTRAMUSCULAR | Status: DC | PRN
  Administered 2024-05-27: 76 mL

## 2024-05-27 MED ORDER — LIDOCAINE HCL (PF) 1 % IJ SOLN
INTRAMUSCULAR | Status: DC | PRN
  Administered 2024-05-27: 5 mL via SUBCUTANEOUS

## 2024-05-27 NOTE — Interval H&P Note (Signed)
 History and Physical Interval Note:  05/27/2024 10:07 AM  Jorge Gregory  has presented today for surgery, with the diagnosis of shortness of breath and coronary artery disease.  The various methods of treatment have been discussed with the patient and family. After consideration of risks, benefits and other options for treatment, the patient has consented to  Procedures: LEFT HEART CATH AND CORS/GRAFTS ANGIOGRAPHY (Left) as a surgical intervention.  The patient's history has been reviewed, patient examined, no change in status, stable for surgery.  I have reviewed the patient's chart and labs.  Questions were answered to the patient's satisfaction.    Cath Lab Visit (complete for each Cath Lab visit)  Clinical Evaluation Leading to the Procedure:   ACS: No.  Non-ACS:    Anginal Classification: CCS III  Anti-ischemic medical therapy: Maximal Therapy (2 or more classes of medications)  Non-Invasive Test Results: Intermediate-risk stress test findings: cardiac mortality 1-3%/year  Prior CABG: Previous CABG  Han Vejar

## 2024-05-27 NOTE — Brief Op Note (Signed)
 BRIEF CARDIAC CATHETERIZATION NOTE  Date: 05/27/2024  TIME: 11:22 AM  PATIENT:  Jorge Gregory  69 y.o. male  PRE-OPERATIVE DIAGNOSIS:  Coronary artery disease with shortness of breath (anginal equivalent)  POST-OPERATIVE DIAGNOSIS:  Same  PROCEDURE:  Procedures: LEFT HEART CATH AND CORS/GRAFTS ANGIOGRAPHY (Left)  SURGEON:  Surgeons and Role:    * Colter Magowan, MD - Primary  FINDINGS: Severe native CAD with CTO of ostial RCA and proximal LCx as well as 95% stenosis of proximal RCA. Widely patent LIMA-LAD, SVG-OM, and SVG-rPDA. Low normal LVEF with inferior hypokinesis.  LVEF 50-55%. Normal LVEDP.  RECOMMENDATIONS: Continue secondary prevention of CAD. Consider pulmonary consultation for workup of shortness of breath.  Lonni Hanson, MD [facility]

## 2024-07-04 ENCOUNTER — Ambulatory Visit: Admitting: Cardiovascular Disease

## 2024-08-02 ENCOUNTER — Other Ambulatory Visit

## 2024-08-18 ENCOUNTER — Ambulatory Visit: Admitting: Internal Medicine
